# Patient Record
Sex: Female | Born: 1980 | Race: White | Hispanic: No | Smoking: Never smoker
Health system: Southern US, Community
[De-identification: ages and names within clinical notes are randomized; demographics above are authoritative.]

## PROBLEM LIST (undated history)

## (undated) ENCOUNTER — Inpatient Hospital Stay (HOSPITAL_COMMUNITY): Payer: Self-pay

## (undated) DIAGNOSIS — Z8711 Personal history of peptic ulcer disease: Secondary | ICD-10-CM

## (undated) DIAGNOSIS — O24419 Gestational diabetes mellitus in pregnancy, unspecified control: Secondary | ICD-10-CM

## (undated) DIAGNOSIS — D649 Anemia, unspecified: Secondary | ICD-10-CM

## (undated) DIAGNOSIS — E559 Vitamin D deficiency, unspecified: Secondary | ICD-10-CM

## (undated) DIAGNOSIS — Z8719 Personal history of other diseases of the digestive system: Secondary | ICD-10-CM

## (undated) HISTORY — PX: TONSILLECTOMY AND ADENOIDECTOMY: SHX28

## (undated) HISTORY — DX: Personal history of peptic ulcer disease: Z87.11

## (undated) HISTORY — PX: CHOLECYSTECTOMY: SHX55

## (undated) HISTORY — DX: Vitamin D deficiency, unspecified: E55.9

## (undated) HISTORY — DX: Anemia, unspecified: D64.9

## (undated) HISTORY — DX: Personal history of other diseases of the digestive system: Z87.19

## (undated) HISTORY — DX: Gestational diabetes mellitus in pregnancy, unspecified control: O24.419

## (undated) HISTORY — PX: WISDOM TOOTH EXTRACTION: SHX21

## (undated) HISTORY — PX: BARTHOLIN GLAND CYST EXCISION: SHX565

---

## 2008-06-24 ENCOUNTER — Emergency Department (HOSPITAL_BASED_OUTPATIENT_CLINIC_OR_DEPARTMENT_OTHER): Admission: EM | Admit: 2008-06-24 | Discharge: 2008-06-24 | Payer: Self-pay | Admitting: Emergency Medicine

## 2009-10-11 ENCOUNTER — Emergency Department (HOSPITAL_BASED_OUTPATIENT_CLINIC_OR_DEPARTMENT_OTHER): Admission: EM | Admit: 2009-10-11 | Discharge: 2009-10-11 | Payer: Self-pay | Admitting: Emergency Medicine

## 2009-10-11 ENCOUNTER — Ambulatory Visit: Payer: Self-pay | Admitting: Diagnostic Radiology

## 2010-08-29 ENCOUNTER — Emergency Department (HOSPITAL_BASED_OUTPATIENT_CLINIC_OR_DEPARTMENT_OTHER)
Admission: EM | Admit: 2010-08-29 | Discharge: 2010-08-29 | Disposition: A | Discharge status: Home / Self Care | Payer: BC Managed Care – PPO | Attending: Emergency Medicine | Admitting: Emergency Medicine

## 2010-08-29 DIAGNOSIS — K589 Irritable bowel syndrome without diarrhea: Secondary | ICD-10-CM | POA: Insufficient documentation

## 2010-08-29 DIAGNOSIS — R197 Diarrhea, unspecified: Secondary | ICD-10-CM | POA: Insufficient documentation

## 2010-08-29 DIAGNOSIS — M79609 Pain in unspecified limb: Secondary | ICD-10-CM | POA: Insufficient documentation

## 2010-08-29 LAB — CBC
HCT: 39.3 % (ref 36.0–46.0)
Hemoglobin: 13.1 g/dL (ref 12.0–15.0)
MCH: 30.1 pg (ref 26.0–34.0)
Platelets: 259 10*3/uL (ref 150–400)
RBC: 4.35 MIL/uL (ref 3.87–5.11)
RDW: 12.5 % (ref 11.5–15.5)
WBC: 14.3 10*3/uL — ABNORMAL HIGH (ref 4.0–10.5)

## 2010-08-29 LAB — DIFFERENTIAL
Basophils Absolute: 0.1 10*3/uL (ref 0.0–0.1)
Basophils Relative: 0 % (ref 0–1)
Eosinophils Absolute: 0.2 10*3/uL (ref 0.0–0.7)
Lymphs Abs: 3.2 10*3/uL (ref 0.7–4.0)
Monocytes Relative: 5 % (ref 3–12)
Neutro Abs: 10.2 10*3/uL — ABNORMAL HIGH (ref 1.7–7.7)
Neutrophils Relative %: 71 % (ref 43–77)

## 2010-08-29 LAB — BASIC METABOLIC PANEL
CO2: 24 mEq/L (ref 19–32)
Calcium: 9.4 mg/dL (ref 8.4–10.5)
Creatinine, Ser: 0.6 mg/dL (ref 0.4–1.2)
GFR calc non Af Amer: >60 mL/min (ref >60)
Potassium: 4 mEq/L (ref 3.5–5.1)

## 2010-08-29 LAB — URINALYSIS, ROUTINE W REFLEX MICROSCOPIC
Ketones, ur: NEGATIVE mg/dL
Leukocytes, UA: NEGATIVE

## 2010-08-29 LAB — PREGNANCY, URINE: Preg Test, Ur: NEGATIVE

## 2013-09-16 ENCOUNTER — Encounter: Payer: Self-pay | Admitting: Obstetrics & Gynecology

## 2013-09-16 ENCOUNTER — Ambulatory Visit (INDEPENDENT_AMBULATORY_CARE_PROVIDER_SITE_OTHER): Payer: BC Managed Care – PPO | Admitting: Obstetrics & Gynecology

## 2013-09-16 VITALS — BP 127/86 | HR 89 | Ht 66.0 in | Wt 250.0 lb

## 2013-09-16 DIAGNOSIS — Z3480 Encounter for supervision of other normal pregnancy, unspecified trimester: Secondary | ICD-10-CM

## 2013-09-16 DIAGNOSIS — O09291 Supervision of pregnancy with other poor reproductive or obstetric history, first trimester: Secondary | ICD-10-CM

## 2013-09-16 DIAGNOSIS — Z3491 Encounter for supervision of normal pregnancy, unspecified, first trimester: Secondary | ICD-10-CM

## 2013-09-16 DIAGNOSIS — E669 Obesity, unspecified: Secondary | ICD-10-CM

## 2013-09-16 DIAGNOSIS — O99210 Obesity complicating pregnancy, unspecified trimester: Secondary | ICD-10-CM | POA: Insufficient documentation

## 2013-09-16 DIAGNOSIS — O99211 Obesity complicating pregnancy, first trimester: Secondary | ICD-10-CM

## 2013-09-16 DIAGNOSIS — O09299 Supervision of pregnancy with other poor reproductive or obstetric history, unspecified trimester: Secondary | ICD-10-CM | POA: Insufficient documentation

## 2013-09-16 DIAGNOSIS — Z3490 Encounter for supervision of normal pregnancy, unspecified, unspecified trimester: Secondary | ICD-10-CM | POA: Insufficient documentation

## 2013-09-16 MED ORDER — CITRANATAL 90 DHA 90-1 & 300 MG PO MISC: 1.0000 | Freq: Every day | ORAL | Status: DC

## 2013-09-16 NOTE — Progress Notes (Signed)
Subjective:    Brittney Moore is a [age] [id number].[id number] at 5634w5d by LMP, being seen today for her first obstetrical visit.  Her obstetrical history is significant for twin demise at 24 weeks (discovered on routine PNV, no PTL/PPROM, was induced and had SVD) and two subsequent term SVDs. Patient does intend to breast feed. Pregnancy history fully reviewed.  Patient reports no complaints.  Had normal pap in 04/2013.  Filed Vitals:   09/16/13 0813 09/16/13 0816  BP: 127/86   Pulse: 89   Height:  5\' 6"  (1.676 m)  Weight: 250 lb (113.399 kg)     HISTORY: OB History  Gravida Para Term Preterm AB SAB TAB Ectopic Multiple Living  4 3 2 1  0 0   1 2    # Outcome Date GA Lbr Len/2nd Weight Sex Delivery Anes PTL Lv  4 CUR           3A PRE 09/16/93 2243w0d    SVD  N ND  3B  09/16/93 7143w0d    SVD  N ND  2 TRM    7 lb (3.175 kg) M SVD        Comments: No antenatal testin  1 TRM    7 lb (3.175 kg) F SVD        Comments: No antenatal testing     Past Medical History  Diagnosis Date  . Anemia   . History of stomach ulcers   . Vitamin D deficiency    Past Surgical History  Procedure Laterality Date  . Cholecystectomy    . Tonsillectomy and adenoidectomy    . Wisdom tooth extraction    . Bartholin gland cyst excision     Family History  Problem Relation Age of Onset  . Congestive Heart Failure Mother   . Leukemia Father   . Heart disease Father   . Diabetes Father      Exam    Uterus:     Pelvic Exam:    Perineum: No Hemorrhoids, Normal Perineum   Vulva: normal   Vagina:  normal discharge   Cervix: multiparous appearance and no cervical motion tenderness   Adnexa: no mass, fullness, tenderness   Bony Pelvis: average and proven to 7 lbs  System: Breast:  normal appearance, no masses or tenderness   Skin: normal coloration and turgor, no rashes   Neurologic: oriented, normal   Extremities: normal strength, tone, and muscle mass, no deformities   HEENT PERRLA   Mouth/Teeth  mucous membranes moist, pharynx normal without lesions and dental hygiene good   Neck supple and no masses   Cardiovascular: regular rate and rhythm   Respiratory:  appears well, vitals normal, no respiratory distress, acyanotic, normal RR, chest clear, no wheezing, crepitations, rhonchi, normal symmetric air entry   Abdomen: soft, non-tender; bowel sounds normal; no masses,  no organomegaly   Urinary: urethral meatus normal   Clinic ultrasound showed SIUP, +cardiac activity   Assessment:    Pregnancy: [id number] Patient Active Problem List   Diagnosis Date Noted  . Supervision of normal pregnancy 09/16/2013  . Obesity in pregnancy, antepartum 09/16/2013  . Prior pregnancy with 24 week twin fetal demise, antepartum 09/16/2013     Plan:   Initial labs drawn.  Will get 1 hr GTT next visit. Continue Prenatal vitamins. Patient interested in referral to Nutritionist; advised ? 15 lb weight gain Problem list reviewed and updated. Genetic Screening discussed First Screen: ordered. Ultrasound discussed; fetal survey: to be ordered later. Follow up  in 4 weeks.  Routine obstetric precautions reviewed.   Tereso NewcomerANYANWU,UGONNA A, MD 09/16/2013

## 2013-09-16 NOTE — Addendum Note (Signed)
Addended by: Granville LewisLARK, Farheen Pfahler L on: 09/16/2013 11:37 AM   Modules accepted: Orders

## 2013-09-16 NOTE — Progress Notes (Signed)
Pt here with spouse for NOB intake.  She has not used BC in 12 years and this is the first pregnancy for FOB.  Last pap Feb 15  No H/O abnormal paps.

## 2013-09-16 NOTE — Patient Instructions (Signed)

## 2013-09-16 NOTE — Progress Notes (Signed)
Bedside U/S showed IUP with FHT of 160 BPM and GA 710w1day  CRL 32.1085mm

## 2013-09-17 LAB — OBSTETRIC PANEL
ANTIBODY SCREEN: NEGATIVE
BASOS ABS: 0 10*3/uL (ref 0.0–0.1)
BASOS PCT: 0 % (ref 0–1)
EOS PCT: 1 % (ref 0–5)
Eosinophils Absolute: 0.1 10*3/uL (ref 0.0–0.7)
HCT: 38.7 % (ref 36.0–46.0)
HEMOGLOBIN: 13 g/dL (ref 12.0–15.0)
Hepatitis B Surface Ag: NEGATIVE
LYMPHS ABS: 1.7 10*3/uL (ref 0.7–4.0)
LYMPHS PCT: 16 % (ref 12–46)
MCH: 30.1 pg (ref 26.0–34.0)
MCHC: 33.6 g/dL (ref 30.0–36.0)
MCV: 89.6 fL (ref 78.0–100.0)
Monocytes Absolute: 0.3 10*3/uL (ref 0.1–1.0)
Monocytes Relative: 3 % (ref 3–12)
NEUTROS ABS: 8.6 10*3/uL — AB (ref 1.7–7.7)
Neutrophils Relative %: 80 % — ABNORMAL HIGH (ref 43–77)
PLATELETS: 252 10*3/uL (ref 150–400)
RBC: 4.32 MIL/uL (ref 3.87–5.11)
RDW: 13.5 % (ref 11.5–15.5)
RH TYPE: POSITIVE
Rubella: 1.15 Index — ABNORMAL HIGH (ref <0.90)
WBC: 10.7 10*3/uL — ABNORMAL HIGH (ref 4.0–10.5)

## 2013-09-17 LAB — HIV ANTIBODY (ROUTINE TESTING W REFLEX): HIV: NONREACTIVE

## 2013-09-17 LAB — CULTURE, OB URINE: Colony Count: 50000

## 2013-09-17 LAB — GC/CHLAMYDIA PROBE AMP
CT Probe RNA: NEGATIVE
GC PROBE AMP APTIMA: NEGATIVE

## 2013-09-19 ENCOUNTER — Encounter: Payer: Self-pay | Admitting: Obstetrics & Gynecology

## 2013-09-19 LAB — CYSTIC FIBROSIS DIAGNOSTIC STUDY

## 2013-09-20 ENCOUNTER — Encounter: Payer: Self-pay | Admitting: *Deleted

## 2013-09-20 ENCOUNTER — Encounter: Payer: BC Managed Care – PPO | Attending: Obstetrics & Gynecology | Admitting: *Deleted

## 2013-09-20 DIAGNOSIS — E669 Obesity, unspecified: Secondary | ICD-10-CM | POA: Diagnosis not present

## 2013-09-20 DIAGNOSIS — O99210 Obesity complicating pregnancy, unspecified trimester: Principal | ICD-10-CM

## 2013-09-20 DIAGNOSIS — Z713 Dietary counseling and surveillance: Secondary | ICD-10-CM | POA: Diagnosis not present

## 2013-09-20 DIAGNOSIS — O99211 Obesity complicating pregnancy, first trimester: Secondary | ICD-10-CM

## 2013-09-20 NOTE — Patient Instructions (Signed)
Goals:  Put away the scale.  Refrain from weighing self between nutrition appointments Reject diet mentality- there are no good or bad foods Listen to internal hunger cues and honor those cues; don't wait until you're ravenous to eat Choose the food(s) you want Enjoy those foods.  Try to make meal last 20 minutes Stop eating when full.  Honor fullness cues too Do these things without any guilt or regret
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
## 2013-10-07 ENCOUNTER — Ambulatory Visit (INDEPENDENT_AMBULATORY_CARE_PROVIDER_SITE_OTHER): Payer: BC Managed Care – PPO | Admitting: Advanced Practice Midwife

## 2013-10-07 VITALS — BP 117/82 | HR 81 | Temp 98.2°F | Wt 248.0 lb

## 2013-10-07 DIAGNOSIS — O2390 Unspecified genitourinary tract infection in pregnancy, unspecified trimester: Secondary | ICD-10-CM

## 2013-10-07 DIAGNOSIS — R319 Hematuria, unspecified: Secondary | ICD-10-CM

## 2013-10-07 DIAGNOSIS — N390 Urinary tract infection, site not specified: Secondary | ICD-10-CM

## 2013-10-07 DIAGNOSIS — O219 Vomiting of pregnancy, unspecified: Secondary | ICD-10-CM

## 2013-10-07 DIAGNOSIS — O2341 Unspecified infection of urinary tract in pregnancy, first trimester: Secondary | ICD-10-CM

## 2013-10-07 LAB — POCT URINALYSIS DIPSTICK
BILIRUBIN UA: NEGATIVE
GLUCOSE UA: NEGATIVE
NITRITE UA: NEGATIVE
Protein, UA: NEGATIVE
Urobilinogen, UA: NEGATIVE
pH, UA: 6.5

## 2013-10-07 MED ORDER — CEPHALEXIN 500 MG PO CAPS: 500.0000 mg | ORAL_CAPSULE | Freq: Four times a day (QID) | ORAL | Status: DC

## 2013-10-07 MED ORDER — DICLEGIS 10-10 MG PO TBEC: 2.0000 | DELAYED_RELEASE_TABLET | ORAL | Status: DC

## 2013-10-07 MED ORDER — METOCLOPRAMIDE HCL 10 MG PO TABS: 10.0000 mg | ORAL_TABLET | Freq: Three times a day (TID) | ORAL | Status: DC

## 2013-10-07 NOTE — Progress Notes (Signed)
Feels like she has UTI symptoms.  C/O's N and V and was told to do the [employer] diet.  Having diarrhea also

## 2013-10-07 NOTE — Patient Instructions (Signed)
Pregnancy and Urinary Tract Infection  A urinary tract infection (UTI) is a bacterial infection of the urinary tract. Infection of the urinary tract can include the ureters, kidneys (pyelonephritis), bladder (cystitis), and urethra (urethritis). All pregnant women should be screened for bacteria in the urinary tract. Identifying and treating a UTI will decrease the risk of preterm labor and developing more serious infections in both the mother and baby.  CAUSES  Bacteria germs cause almost all UTIs.   RISK FACTORS  Many factors can increase your chances of getting a UTI during pregnancy. These include:  · Having a short urethra.  · Poor toilet and hygiene habits.  · Sexual intercourse.  · Blockage of urine along the urinary tract.  · Problems with the pelvic muscles or nerves.  · Diabetes.  · Obesity.  · Bladder problems after having several children.  · Previous history of UTI.  SIGNS AND SYMPTOMS   · Pain, burning, or a stinging feeling when urinating.  · Suddenly feeling the need to urinate right away (urgency).  · Loss of bladder control (urinary incontinence).  · Frequent urination, more than is common with pregnancy.  · Lower abdominal or back discomfort.  · Cloudy urine.  · Blood in the urine (hematuria).  · Fever.   When the kidneys are infected, the symptoms may be:  · Back pain.  · Flank pain on the right side more so than the left.  · Fever.  · Chills.  · Nausea.  · Vomiting.  DIAGNOSIS   A urinary tract infection is usually diagnosed through urine tests. Additional tests and procedures are sometimes done. These may include:  · Ultrasound exam of the kidneys, ureters, bladder, and urethra.  · Looking in the bladder with a lighted tube (cystoscopy).  TREATMENT  Typically, UTIs can be treated with antibiotic medicines.   HOME CARE INSTRUCTIONS   · Only take over-the-counter or prescription medicines as directed by your health care provider. If you were prescribed antibiotics, take them as directed. Finish  them even if you start to feel better.  · Drink enough fluids to keep your urine clear or pale yellow.  · Do not have sexual intercourse until the infection is gone and your health care provider says it is okay.  · Make sure you are tested for UTIs throughout your pregnancy. These infections often come back.   Preventing a UTI in the Future  · Practice good toilet habits. Always wipe from front to back. Use the tissue only once.  · Do not hold your urine. Empty your bladder as soon as possible when the urge comes.  · Do not douche or use deodorant sprays.  · Wash with soap and warm water around the genital area and the anus.  · Empty your bladder before and after sexual intercourse.  · Wear underwear with a cotton crotch.  · Avoid caffeine and carbonated drinks. They can irritate the bladder.  · Drink cranberry juice or take cranberry pills. This may decrease the risk of getting a UTI.  · Do not drink alcohol.  · Keep all your appointments and tests as scheduled.   SEEK MEDICAL CARE IF:   · Your symptoms get worse.  · You are still having fevers 2 or more days after treatment begins.  · You have a rash.  · You feel that you are having problems with medicines prescribed.  · You have abnormal vaginal discharge.  SEEK IMMEDIATE MEDICAL CARE IF:   · You have back or flank   well or get worse.  Document Released: 07/02/2010 Document Revised: 12/26/2012 Document Reviewed: 10/04/2012 [facility] Patient Information 2015 [employer] [place]. This information is not intended to replace advice given to you by your health care provider. Make sure you discuss any questions you have with your health care provider.   If you  develop a yeast infection after your antibiotics you may use over-the-counter Monistat 7.

## 2013-10-09 ENCOUNTER — Telehealth: Payer: Self-pay | Admitting: *Deleted

## 2013-10-09 DIAGNOSIS — N390 Urinary tract infection, site not specified: Secondary | ICD-10-CM

## 2013-10-09 LAB — CULTURE, URINE COMPREHENSIVE: Colony Count: >=100000

## 2013-10-09 MED ORDER — SULFAMETHOXAZOLE-TMP DS 800-160 MG PO TABS: 1.0000 | ORAL_TABLET | Freq: Two times a day (BID) | ORAL | Status: DC

## 2013-10-09 NOTE — Telephone Encounter (Signed)
Pt called stating that her Keflex has given her a rash since starting it on Monday.  Spoke with Dr Penne LashLeggett, who discontinued it and new RX for Bactrim DS called to pharmacy.

## 2013-10-10 DIAGNOSIS — O2340 Unspecified infection of urinary tract in pregnancy, unspecified trimester: Secondary | ICD-10-CM | POA: Insufficient documentation

## 2013-10-10 NOTE — Progress Notes (Signed)
Reports dysuria, urgency, frequency, LBP and few loose stools. Sees blood when she urinates, but unsure if blood is vaginal or urinary in origin. Has been having N/V of pregnancy vomiting 0-1 x per day, btu w/ constant nausea. Sx have worsened over the past few days. Hasn't tried anything for Sx. No blood in vault of spec exam. Physiologic discharge.  No clear CVAT. Suspect UTI w/ possible gastroenteritis vs GI Sx from severe UTI. Rx Reglan and Macrobid. Culture urine. Diclegis for N/V of pregnancy. Increase fluids.

## 2013-10-14 ENCOUNTER — Encounter: Payer: Self-pay | Admitting: *Deleted

## 2013-10-14 ENCOUNTER — Ambulatory Visit (INDEPENDENT_AMBULATORY_CARE_PROVIDER_SITE_OTHER): Payer: BC Managed Care – PPO | Admitting: Advanced Practice Midwife

## 2013-10-14 VITALS — BP 119/81 | HR 84 | Wt 247.0 lb

## 2013-10-14 DIAGNOSIS — Z3480 Encounter for supervision of other normal pregnancy, unspecified trimester: Secondary | ICD-10-CM

## 2013-10-14 DIAGNOSIS — Z3482 Encounter for supervision of other normal pregnancy, second trimester: Secondary | ICD-10-CM

## 2013-10-14 NOTE — Patient Instructions (Signed)
Second Trimester of Pregnancy The second trimester is from week 13 through week 28, months 4 through 6. The second trimester is often a time when you feel your best. Your body has also adjusted to being pregnant, and you begin to feel better physically. Usually, morning sickness has lessened or quit completely, you may have more energy, and you may have an increase in appetite. The second trimester is also a time when the fetus is growing rapidly. At the end of the sixth month, the fetus is about 9 inches long and weighs about 1 pounds. You will likely begin to feel the baby move (quickening) between 18 and 20 weeks of the pregnancy. BODY CHANGES Your body goes through many changes during pregnancy. The changes vary from woman to woman.   Your weight will continue to increase. You will notice your lower abdomen bulging out.  You may begin to get stretch marks on your hips, abdomen, and breasts.  You may develop headaches that can be relieved by medicines approved by your health care provider.  You may urinate more often because the fetus is pressing on your bladder.  You may develop or continue to have heartburn as a result of your pregnancy.  You may develop constipation because certain hormones are causing the muscles that push waste through your intestines to slow down.  You may develop hemorrhoids or swollen, bulging veins (varicose veins).  You may have back pain because of the weight gain and pregnancy hormones relaxing your joints between the bones in your pelvis and as a result of a shift in weight and the muscles that support your balance.  Your breasts will continue to grow and be tender.  Your gums may bleed and may be sensitive to brushing and flossing.  Dark spots or blotches (chloasma, mask of pregnancy) may develop on your face. This will likely fade after the baby is born.  A dark line from your belly button to the pubic area (linea nigra) may appear. This will likely fade  after the baby is born.  You may have changes in your hair. These can include thickening of your hair, rapid growth, and changes in texture. Some women also have hair loss during or after pregnancy, or hair that feels dry or thin. Your hair will most likely return to normal after your baby is born. WHAT TO EXPECT AT YOUR PRENATAL VISITS During a routine prenatal visit:  You will be weighed to make sure you and the fetus are growing normally.  Your blood pressure will be taken.  Your abdomen will be measured to track your baby's growth.  The fetal heartbeat will be listened to.  Any test results from the previous visit will be discussed. Your health care provider may ask you:  How you are feeling.  If you are feeling the baby move.  If you have had any abnormal symptoms, such as leaking fluid, bleeding, severe headaches, or abdominal cramping.  If you have any questions. Other tests that may be performed during your second trimester include:  Blood tests that check for:  Low iron levels (anemia).  Gestational diabetes (between 24 and 28 weeks).  Rh antibodies.  Urine tests to check for infections, diabetes, or protein in the urine.  An ultrasound to confirm the proper growth and development of the baby.  An amniocentesis to check for possible genetic problems.  Fetal screens for spina bifida and Down syndrome. HOME CARE INSTRUCTIONS   Avoid all smoking, herbs, alcohol, and unprescribed   drugs. These chemicals affect the formation and growth of the baby.  Follow your health care provider's instructions regarding medicine use. There are medicines that are either safe or unsafe to take during pregnancy.  Exercise only as directed by your health care provider. Experiencing uterine cramps is a good sign to stop exercising.  Continue to eat regular, healthy meals.  Wear a good support bra for breast tenderness.  Do not use hot tubs, steam rooms, or saunas.  Wear your  seat belt at all times when driving.  Avoid raw meat, uncooked cheese, cat litter boxes, and soil used by cats. These carry germs that can cause birth defects in the baby.  Take your prenatal vitamins.  Try taking a stool softener (if your health care provider approves) if you develop constipation. Eat more high-fiber foods, such as fresh vegetables or fruit and whole grains. Drink plenty of fluids to keep your urine clear or pale yellow.  Take warm sitz baths to soothe any pain or discomfort caused by hemorrhoids. Use hemorrhoid cream if your health care provider approves.  If you develop varicose veins, wear support hose. Elevate your feet for 15 minutes, 3-4 times a day. Limit salt in your diet.  Avoid heavy lifting, wear low heel shoes, and practice good posture.  Rest with your legs elevated if you have leg cramps or low back pain.  Visit your dentist if you have not gone yet during your pregnancy. Use a soft toothbrush to brush your teeth and be gentle when you floss.  A sexual relationship may be continued unless your health care provider directs you otherwise.  Continue to go to all your prenatal visits as directed by your health care provider. SEEK MEDICAL CARE IF:   You have dizziness.  You have mild pelvic cramps, pelvic pressure, or nagging pain in the abdominal area.  You have persistent nausea, vomiting, or diarrhea.  You have a bad smelling vaginal discharge.  You have pain with urination. SEEK IMMEDIATE MEDICAL CARE IF:   You have a fever.  You are leaking fluid from your vagina.  You have spotting or bleeding from your vagina.  You have severe abdominal cramping or pain.  You have rapid weight gain or loss.  You have shortness of breath with chest pain.  You notice sudden or extreme swelling of your face, hands, ankles, feet, or legs.  You have not felt your baby move in over an hour.  You have severe headaches that do not go away with  medicine.  You have vision changes. Document Released: 03/01/2001 Document Revised: 03/12/2013 Document Reviewed: 05/08/2012 [employer] Patient Information 2015 [employer]. This information is not intended to replace advice given to you by your health care provider. Make sure you discuss any questions you have with your health care provider.  

## 2013-10-14 NOTE — Progress Notes (Signed)
First Screen not ordered at last visit. Can do it up till tomorrow.  Will try to see if they can do it today. >> MFM will do tomorrow.  Spec exam for post coital bleeding:  No blood visible, cervix closed.  UA clear.

## 2013-10-14 NOTE — Progress Notes (Signed)
Spotted after intercourse on Friday.

## 2013-10-15 ENCOUNTER — Ambulatory Visit (HOSPITAL_COMMUNITY): Admission: RE | Admit: 2013-10-15 | Payer: BC Managed Care – PPO | Source: Ambulatory Visit

## 2013-10-15 ENCOUNTER — Other Ambulatory Visit: Payer: Self-pay | Admitting: Advanced Practice Midwife

## 2013-10-15 ENCOUNTER — Ambulatory Visit (HOSPITAL_COMMUNITY)
Admission: RE | Admit: 2013-10-15 | Discharge: 2013-10-15 | Disposition: A | Discharge status: Home / Self Care | Payer: BC Managed Care – PPO | Source: Ambulatory Visit | Attending: Obstetrics & Gynecology | Admitting: Obstetrics & Gynecology

## 2013-10-15 DIAGNOSIS — Z3482 Encounter for supervision of other normal pregnancy, second trimester: Secondary | ICD-10-CM

## 2013-10-15 DIAGNOSIS — Z3689 Encounter for other specified antenatal screening: Secondary | ICD-10-CM | POA: Insufficient documentation

## 2013-10-16 ENCOUNTER — Encounter: Payer: Self-pay | Admitting: Advanced Practice Midwife

## 2013-10-31 ENCOUNTER — Encounter: Payer: Self-pay | Admitting: *Deleted

## 2013-10-31 ENCOUNTER — Encounter: Payer: BC Managed Care – PPO | Attending: Obstetrics & Gynecology | Admitting: *Deleted

## 2013-10-31 DIAGNOSIS — O99210 Obesity complicating pregnancy, unspecified trimester: Principal | ICD-10-CM

## 2013-10-31 DIAGNOSIS — O99212 Obesity complicating pregnancy, second trimester: Secondary | ICD-10-CM

## 2013-10-31 DIAGNOSIS — E669 Obesity, unspecified: Secondary | ICD-10-CM | POA: Insufficient documentation

## 2013-10-31 DIAGNOSIS — Z713 Dietary counseling and surveillance: Secondary | ICD-10-CM | POA: Diagnosis not present

## 2013-10-31 NOTE — Progress Notes (Signed)
  Medical Nutrition Therapy:  Appt start time: 0900 end time:  0930.  Assessment:  Primary concerns today: Brittney Moore is here for follow up nutrition counseling pertaining to heigh weight gain.  She has been following my recommendations for intuitive eating and doing really well.  She eats when she is hungry and stops when she is full.  She isn't counting calories or feeling guilty about her food choices.  States she has been really sick and has lost weight.  Her baby is growing fine, despite her weight loss.  She thinks she's not back down to her pre-gravid weight, but she is loosing.  She's very pleased with her progress and pleased to have a different approach to food.    Preferred Learning Style:   Auditory  Learning Readiness:   Change in progress  MEDICATIONS: prenatal vitamins, iron, vitamin d, omega-3   DIETARY INTAKE:  Usual eating pattern includes 3 meals and 0-2 snacks per day.  Everyday foods include lean proteins, starches, fruits and vegetables.  Avoided foods include "junk";.    24-hr recall:  B ( AM): small amount of cereal with enough milk to cover it 1/2 bagel with 1 tbsp cream cheese Snk ( AM): maybe, if hungry  L ( PM): 2 potatoes with fat free sour cream and 2-3 tsp butter and frozen entree with pasta Snk ( PM): fruit or fruit snack.  Limits sugary foods.  Hasn't been feeling them.  Did eat more candy yesterday, but ate it mindfully D ( PM): hamburger helper or spaghetti Snk ( PM): sometimes frozen snickers bars Beverages: water all day long.  Juice makes her sick  Usual physical activity: none right now due to poor energy.    Estimated energy needs: 2000 calories 225 g carbohydrates 150 g protein 56 g fat    Nutritional Diagnosis:  NB-1.5 Disordered eating pattern As related to history of extreme dieting and mindless eating.  As evidenced by dietary recall.    Intervention:  Nutrition counseling provided.  Answered questions and discussed recommendations  for physical activity: walking when she feels up to it for 10-30 minutes 2-5 days/week.  Suggested adding protein to snack.  Keep up great work!  Teaching Method Utilized:  Auditory   Barriers to learning/adherence to lifestyle change: none  Demonstrated degree of understanding via:  Teach Back   Monitoring/Evaluation:  Dietary intake, exercise, lab results, and body weight prn

## 2013-11-11 ENCOUNTER — Ambulatory Visit (INDEPENDENT_AMBULATORY_CARE_PROVIDER_SITE_OTHER): Payer: BC Managed Care – PPO | Admitting: Advanced Practice Midwife

## 2013-11-11 VITALS — BP 120/82 | HR 88 | Wt 252.0 lb

## 2013-11-11 DIAGNOSIS — O219 Vomiting of pregnancy, unspecified: Secondary | ICD-10-CM

## 2013-11-11 DIAGNOSIS — Z3480 Encounter for supervision of other normal pregnancy, unspecified trimester: Secondary | ICD-10-CM

## 2013-11-11 DIAGNOSIS — Z3482 Encounter for supervision of other normal pregnancy, second trimester: Secondary | ICD-10-CM

## 2013-11-11 MED ORDER — DICLEGIS 10-10 MG PO TBEC: DELAYED_RELEASE_TABLET | ORAL | Status: DC

## 2013-11-11 NOTE — Progress Notes (Signed)
Good fetal movement, denies vaginal bleeding, LOF, regular contractions.  Nausea daily with vomiting some days.  Does not want to take meds.  Reviewed safety of medications, importance of keeping down food and fluids.  Reassurance provided that nausea will likely improve after 20 weeks.  Diclegis Rx renewed, discussed taking Vitamin B 6 only if preferred.  Anatomy U/S ordered for 19 weeks. Early 1 hour GTT today.

## 2013-11-12 LAB — GLUCOSE TOLERANCE, 1 HOUR (50G) W/O FASTING: Glucose, 1 Hour GTT: 153 mg/dL — ABNORMAL HIGH (ref 70–140)

## 2013-11-13 ENCOUNTER — Telehealth: Payer: Self-pay | Admitting: *Deleted

## 2013-11-13 DIAGNOSIS — R7309 Other abnormal glucose: Secondary | ICD-10-CM

## 2013-11-13 NOTE — Telephone Encounter (Signed)
Pt notified of abnormal 1 hr GTT and scheduled for 3 hour GTT Friday 11/15/13

## 2013-11-14 LAB — ALPHA FETOPROTEIN, MATERNAL
AFP: 21.7 ng/mL
Curr Gest Age: 17.5 wks.days
MOM FOR AFP: 0.74
Open Spina bifida: NEGATIVE
Osb Risk: <1:27300 {titer}

## 2013-11-16 LAB — GLUCOSE TOLERANCE, 3 HOURS
GLUCOSE 3 HOUR GTT: 127 mg/dL (ref 70–144)
Glucose Tolerance, 1 hour: 172 mg/dL (ref 70–189)
Glucose Tolerance, 2 hour: 174 mg/dL — ABNORMAL HIGH (ref 70–164)
Glucose Tolerance, Fasting: 98 mg/dL (ref 70–104)

## 2013-11-18 ENCOUNTER — Telehealth: Payer: Self-pay | Admitting: *Deleted

## 2013-11-18 NOTE — Telephone Encounter (Signed)
Pt notified of normal 3 hr GTT. 

## 2013-11-20 ENCOUNTER — Encounter: Payer: Self-pay | Admitting: Advanced Practice Midwife

## 2013-11-26 ENCOUNTER — Ambulatory Visit (HOSPITAL_COMMUNITY)
Admission: RE | Admit: 2013-11-26 | Discharge: 2013-11-26 | Disposition: A | Discharge status: Home / Self Care | Payer: BC Managed Care – PPO | Source: Ambulatory Visit | Attending: Advanced Practice Midwife | Admitting: Advanced Practice Midwife

## 2013-11-26 DIAGNOSIS — E669 Obesity, unspecified: Secondary | ICD-10-CM | POA: Insufficient documentation

## 2013-11-26 DIAGNOSIS — O99210 Obesity complicating pregnancy, unspecified trimester: Secondary | ICD-10-CM | POA: Diagnosis present

## 2013-11-26 DIAGNOSIS — O09292 Supervision of pregnancy with other poor reproductive or obstetric history, second trimester: Secondary | ICD-10-CM

## 2013-11-26 DIAGNOSIS — O99212 Obesity complicating pregnancy, second trimester: Secondary | ICD-10-CM

## 2013-11-26 DIAGNOSIS — O09299 Supervision of pregnancy with other poor reproductive or obstetric history, unspecified trimester: Secondary | ICD-10-CM | POA: Insufficient documentation

## 2013-11-26 DIAGNOSIS — O99213 Obesity complicating pregnancy, third trimester: Secondary | ICD-10-CM

## 2013-11-26 DIAGNOSIS — Z3482 Encounter for supervision of other normal pregnancy, second trimester: Secondary | ICD-10-CM

## 2013-11-28 ENCOUNTER — Ambulatory Visit (INDEPENDENT_AMBULATORY_CARE_PROVIDER_SITE_OTHER): Payer: BC Managed Care – PPO | Admitting: Obstetrics & Gynecology

## 2013-11-28 ENCOUNTER — Encounter: Payer: Self-pay | Admitting: Obstetrics & Gynecology

## 2013-11-28 ENCOUNTER — Telehealth: Payer: Self-pay | Admitting: *Deleted

## 2013-11-28 ENCOUNTER — Encounter: Payer: Self-pay | Admitting: *Deleted

## 2013-11-28 VITALS — BP 125/83 | HR 87 | Temp 97.7°F | Wt 254.0 lb

## 2013-11-28 DIAGNOSIS — Z3480 Encounter for supervision of other normal pregnancy, unspecified trimester: Secondary | ICD-10-CM

## 2013-11-28 DIAGNOSIS — Z3492 Encounter for supervision of normal pregnancy, unspecified, second trimester: Secondary | ICD-10-CM

## 2013-11-28 DIAGNOSIS — Z3482 Encounter for supervision of other normal pregnancy, second trimester: Secondary | ICD-10-CM

## 2013-11-28 DIAGNOSIS — G4730 Sleep apnea, unspecified: Secondary | ICD-10-CM

## 2013-11-28 MED ORDER — MEDICAL COMPRESSION STOCKINGS MISC: 1.0000 "application " | Status: DC | PRN

## 2013-11-28 MED ORDER — ONDANSETRON HCL 8 MG PO TABS: 8.0000 mg | ORAL_TABLET | Freq: Three times a day (TID) | ORAL | Status: DC | PRN

## 2013-11-28 NOTE — Progress Notes (Signed)
Pt having increased nausea during the day.  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  Pt having headaches and problems sleeping.  Suggested to take Tylenol.  Pt admits to being diagnosed with sleep apnea.  Pt agrees to make appt with sleep MD and see if CPAP is necessary.  She is 30+ pounds heavier than she was upon diagnosis.   Pt needs f/u US for incomplete anatomy.  Pt having LE swelling.  Will try compression stockings.

## 2013-11-28 NOTE — Patient Instructions (Signed)
Compression Stockings Compression stockings are elastic stockings that "compress" your legs. This helps to increase blood flow, decrease swelling, and reduces the chance of getting blood clots in your lower legs. Compression stockings are used:  After surgery.  If you have a history of poor circulation.  If you are prone to blood clots.  If you have varicose veins.  If you sit or are bedridden for long periods of time. WEARING COMPRESSION STOCKINGS  Your compression stockings should be worn as instructed by your caregiver.  Wearing the correct stocking size is important. Your caregiver can help measure and fit you to the correct size.  When wearing your stockings, do not allow the stockings to bunch up. This is especially important around your toes or behind your knees. Keep the stockings as smooth as possible.  Do not roll the stockings downward and leave them rolled down. This can form a restrictive band around your legs and can decrease blood flow.  The stockings should be removed once a day for 1 hour or as instructed by your caregiver. When the stockings are taken off, inspect your legs and feet. Look for:  Open sores.  Red spots.  Puffy areas (swelling).  Anything that does not seem normal. IMPORTANT INFORMATION ABOUT COMPRESSION STOCKINGS  The compression stockings should be clean, dry, and in good condition before you put them on.  Do not put lotion on your legs or feet. This makes it harder to put the stockings on.  Change your stockings immediately if they become wet or soiled.  Do not wear stockings that are ripped or torn.  You may hand-wash or put your stockings in the washing machine. Use cold or warm water with mild detergent. Do not bleach your stockings. They may be air-dried or dried in the dryer on low heat.  If you have pain or have a feeling of "pins and needles" in your feet or legs, you may be wearing stockings that are too tight. Call your caregiver  right away. SEEK IMMEDIATE MEDICAL CARE IF:   You have numbness or tingling in your lower legs that does not get better quickly after the stockings are removed.  Your toes or feet become cold and blue.  You develop open sores or have red spots on your legs that do not go away. MAKE SURE YOU:   Understand these instructions.  Will watch your condition.  Will get help right away if you are not doing well or get worse. Document Released: 01/02/2009 Document Revised: 05/30/2011 Document Reviewed: 01/02/2009 ExitCare Patient Information 2015 [employer]. This information is not intended to replace advice given to you by your health care provider. Make sure you discuss any questions you have with your health care provider.  

## 2013-11-28 NOTE — Progress Notes (Signed)
N & V today with malaise.  Denies any diarrhea.  Episodes of hot/cold sweats and increase in headaches.  Discharge from [employer] area.  Ketones=small  Ph 1.020

## 2013-11-28 NOTE — Telephone Encounter (Signed)
Pt needs script for compression stockings so insurance can be billed for them. Pt req script be [facility] to [facility] [address] in [place] to fax number [phone]

## 2013-11-30 NOTE — Addendum Note (Signed)
Encounter addended by: Hurshel Party, CNM on: 11/30/2013  5:06 PM<BR>     Documentation filed: Problem List

## 2013-12-10 ENCOUNTER — Encounter: Payer: BC Managed Care – PPO | Admitting: Obstetrics & Gynecology

## 2013-12-17 ENCOUNTER — Ambulatory Visit (HOSPITAL_COMMUNITY)
Admission: RE | Admit: 2013-12-17 | Discharge: 2013-12-17 | Disposition: A | Discharge status: Home / Self Care | Payer: BC Managed Care – PPO | Source: Ambulatory Visit | Attending: Obstetrics & Gynecology | Admitting: Obstetrics & Gynecology

## 2013-12-17 ENCOUNTER — Encounter: Payer: BC Managed Care – PPO | Admitting: Obstetrics & Gynecology

## 2013-12-17 DIAGNOSIS — Z3689 Encounter for other specified antenatal screening: Secondary | ICD-10-CM | POA: Diagnosis not present

## 2013-12-17 DIAGNOSIS — Z3492 Encounter for supervision of normal pregnancy, unspecified, second trimester: Secondary | ICD-10-CM

## 2013-12-18 ENCOUNTER — Encounter: Payer: Self-pay | Admitting: Obstetrics & Gynecology

## 2013-12-24 ENCOUNTER — Ambulatory Visit (INDEPENDENT_AMBULATORY_CARE_PROVIDER_SITE_OTHER): Payer: BC Managed Care – PPO | Admitting: Obstetrics & Gynecology

## 2013-12-24 VITALS — BP 117/73 | HR 81 | Wt 252.0 lb

## 2013-12-24 DIAGNOSIS — Z3490 Encounter for supervision of normal pregnancy, unspecified, unspecified trimester: Secondary | ICD-10-CM

## 2013-12-24 NOTE — Progress Notes (Signed)
Routine visit. Good FM. Glucola, labs, tdap at next visit.

## 2013-12-26 ENCOUNTER — Telehealth: Payer: Self-pay

## 2013-12-26 ENCOUNTER — Inpatient Hospital Stay (HOSPITAL_COMMUNITY)
Admission: AD | Admit: 2013-12-26 | Discharge: 2013-12-26 | Disposition: A | Discharge status: Home / Self Care | Payer: BC Managed Care – PPO | Source: Ambulatory Visit | Attending: Obstetrics & Gynecology | Admitting: Obstetrics & Gynecology

## 2013-12-26 ENCOUNTER — Encounter (HOSPITAL_COMMUNITY): Payer: Self-pay | Admitting: *Deleted

## 2013-12-26 DIAGNOSIS — O99712 Diseases of the skin and subcutaneous tissue complicating pregnancy, second trimester: Secondary | ICD-10-CM | POA: Diagnosis present

## 2013-12-26 DIAGNOSIS — L299 Pruritus, unspecified: Secondary | ICD-10-CM | POA: Diagnosis not present

## 2013-12-26 DIAGNOSIS — O26892 Other specified pregnancy related conditions, second trimester: Secondary | ICD-10-CM

## 2013-12-26 DIAGNOSIS — Z3A24 24 weeks gestation of pregnancy: Secondary | ICD-10-CM | POA: Diagnosis not present

## 2013-12-26 DIAGNOSIS — L308 Other specified dermatitis: Secondary | ICD-10-CM

## 2013-12-26 LAB — COMPREHENSIVE METABOLIC PANEL
ALK PHOS: 61 U/L (ref 39–117)
ALT: 16 U/L (ref 0–35)
AST: 16 U/L (ref 0–37)
Albumin: 2.7 g/dL — ABNORMAL LOW (ref 3.5–5.2)
Anion gap: 11 (ref 5–15)
BUN: 4 mg/dL — ABNORMAL LOW (ref 6–23)
CHLORIDE: 103 meq/L (ref 96–112)
CO2: 22 mEq/L (ref 19–32)
CREATININE: 0.47 mg/dL — AB (ref 0.50–1.10)
Calcium: 8.6 mg/dL (ref 8.4–10.5)
GFR calc Af Amer: >90 mL/min (ref >90)
GFR calc non Af Amer: >90 mL/min (ref >90)
Glucose, Bld: 86 mg/dL (ref 70–99)
POTASSIUM: 4.2 meq/L (ref 3.7–5.3)
Sodium: 136 mEq/L — ABNORMAL LOW (ref 137–147)
Total Protein: 6.4 g/dL (ref 6.0–8.3)

## 2013-12-26 LAB — FETAL FIBRONECTIN: FETAL FIBRONECTIN: NEGATIVE

## 2013-12-26 LAB — WET PREP, GENITAL
CLUE CELLS WET PREP: NONE SEEN
Trich, Wet Prep: NONE SEEN
WBC WET PREP: NONE SEEN
Yeast Wet Prep HPF POC: NONE SEEN

## 2013-12-26 LAB — URINALYSIS, ROUTINE W REFLEX MICROSCOPIC
Bilirubin Urine: NEGATIVE
GLUCOSE, UA: NEGATIVE mg/dL
HGB URINE DIPSTICK: NEGATIVE
Ketones, ur: 40 mg/dL — AB
LEUKOCYTES UA: NEGATIVE
Nitrite: NEGATIVE
PH: 6 (ref 5.0–8.0)
PROTEIN: NEGATIVE mg/dL
Specific Gravity, Urine: 1.025 (ref 1.005–1.030)
Urobilinogen, UA: 0.2 mg/dL (ref 0.0–1.0)

## 2013-12-26 MED ORDER — DIPHENHYDRAMINE HCL 25 MG PO CAPS
25.0000 mg | ORAL_CAPSULE | Freq: Once | ORAL | Status: AC
  Administered 2013-12-26: 25 mg via ORAL
  Filled 2013-12-26: qty 1

## 2013-12-26 NOTE — Telephone Encounter (Signed)
Patient walked in office to be seen this morning for a rash. Patient stated she spoke with call-a-nurse and was told to come in at 8am. Spoke with patient and let her know that we do not have a provider on Thursday mornings but we can see her this afternoon. Patient stated she could not do this afternoon. Patient was offered to go to MAU this morning but she did not respond if she would go or not and she walked out of the office.

## 2013-12-26 NOTE — MAU Provider Note (Signed)
Attestation of Attending Supervision of Advanced Practitioner (CNM/NP): Evaluation and management procedures were performed by the Advanced Practitioner under my supervision and collaboration.  I have reviewed the Advanced Practitioner's note and chart, and I agree with the management and plan.  HARRAWAY-SMITH, Willer Osorno 10:16 PM     

## 2013-12-26 NOTE — MAU Provider Note (Signed)
History     CSN: [id number]  Arrival date and time: 12/26/13 [id number]   First Provider Initiated Contact with Patient 12/26/13 1008      Chief Complaint  Patient presents with  . Pruritis   HPI  [name] is a 33 y.o. [id number] at 8329w1d who presents with intense, diffuse, pruritis since 8:30pm yesterday. Patient states that the itching started in her thighs and quickly spread throughout her body, including her palms, soles, and eyes. Was unable to sleep last night because of the pain. No rashes. No new exposures to foods, medications, or detergents. No difficulty breathing. Has not tried any medication for itching. Has not noticed anything that makes itching better or worse. Denies abdominal pain. No nausea or vomiting. No fevers or chills. Patient reports prior cholecystectomy.   Also reports that mucus vaginal discharge this morning while using the bathroom. Denies any large gush of fluid. No bleeding. Denies contractions. Reports good fetal movement.   OB History   Grav Para Term Preterm Abortions TAB SAB Ect Mult Living   6 3 2 1 2  2  1 2       Past Medical History  Diagnosis Date  . History of stomach ulcers   . Vitamin D deficiency   . Anemia     Past Surgical History  Procedure Laterality Date  . Cholecystectomy    . Tonsillectomy and adenoidectomy    . Wisdom tooth extraction    . Bartholin gland cyst excision      Family History  Problem Relation Age of Onset  . Congestive Heart Failure Mother   . Leukemia Father   . Heart disease Father   . Diabetes Father   . Cancer Maternal Grandfather   . Mental illness Paternal Grandmother   . Cancer Paternal Grandfather     History  Substance Use Topics  . Smoking status: Never Smoker   . Smokeless tobacco: Never Used  . Alcohol Use: No    Allergies:  Allergies  Allergen Reactions  . Food     paprika  . Keflex [Cephalexin] Rash    Prescriptions prior to admission  Medication Sig Dispense Refill   . ferrous sulfate 325 (65 FE) MG tablet Take 325 mg by mouth daily with breakfast.      . Magnesium 250 MG TABS Take 250 mg by mouth daily.      . Omega-3 Fatty Acids (FISH OIL) 500 MG CAPS Take by mouth.      . Prenat w/o A-FeCbGl-DSS-FA-DHA (CITRANATAL 90 DHA) 90-1 & 300 MG MISC Take 1 tablet by mouth daily.  30 each  12  . VITAMIN D, CHOLECALCIFEROL, PO Take 50,000 Units by mouth once a week.       Jae Dire. Elastic Bandages & Supports (MEDICAL COMPRESSION STOCKINGS) MISC 1 application by Does not apply route as needed.  1 each  1    Review of Systems  All other systems reviewed and are negative.  Physical Exam   Blood pressure 123/73, pulse 81, temperature 98.2 F (36.8 C), temperature source Oral, height 5\' 6"  (1.676 m), weight 114.034 kg (251 lb 6.4 oz), last menstrual period 07/10/2013.  Physical Exam  Nursing note and vitals reviewed. Constitutional: She is oriented to person, place, and time. She appears well-developed and well-nourished. No distress.  HENT:  Head: Normocephalic and atraumatic.  Neck: Normal range of motion.  Cardiovascular: Normal rate, regular rhythm and normal heart sounds.   No murmur heard. Respiratory: Effort normal and breath sounds normal.  No respiratory distress.  GI: Soft. She exhibits no distension. There is no hepatosplenomegaly. There is no tenderness. There is negative Murphy's sign.  Uterine size appropriate for gestational age  Neurological: She is alert and oriented to person, place, and time.  Skin: Skin is warm and dry. No rash noted.  FHT:  FHR: 140 bpm, variability: moderate,  accelerations:  present,  decelerations:  none    Results for orders placed during the hospital encounter of 12/26/13 (from the past 24 hour(s))  URINALYSIS, ROUTINE W REFLEX MICROSCOPIC     Status: Abnormal   Collection Time    12/26/13  8:40 AM      Result Value Ref Range   Color, Urine YELLOW  YELLOW   APPearance CLEAR  CLEAR   Specific Gravity, Urine 1.025   1.005 - 1.030   pH 6.0  5.0 - 8.0   Glucose, UA NEGATIVE  NEGATIVE mg/dL   Hgb urine dipstick NEGATIVE  NEGATIVE   Bilirubin Urine NEGATIVE  NEGATIVE   Ketones, ur 40 (*) NEGATIVE mg/dL   Protein, ur NEGATIVE  NEGATIVE mg/dL   Urobilinogen, UA 0.2  0.0 - 1.0 mg/dL   Nitrite NEGATIVE  NEGATIVE   Leukocytes, UA NEGATIVE  NEGATIVE  COMPREHENSIVE METABOLIC PANEL     Status: Abnormal   Collection Time    12/26/13 10:28 AM      Result Value Ref Range   Sodium 136 (*) 137 - 147 mEq/L   Potassium 4.2  3.7 - 5.3 mEq/L   Chloride 103  96 - 112 mEq/L   CO2 22  19 - 32 mEq/L   Glucose, Bld 86  70 - 99 mg/dL   BUN 4 (*) 6 - 23 mg/dL   Creatinine, Ser 7.82 (*) 0.50 - 1.10 mg/dL   Calcium 8.6  8.4 - 95.6 mg/dL   Total Protein 6.4  6.0 - 8.3 g/dL   Albumin 2.7 (*) 3.5 - 5.2 g/dL   AST 16  0 - 37 U/L   ALT 16  0 - 35 U/L   Alkaline Phosphatase 61  39 - 117 U/L   Total Bilirubin <0.2 (*) 0.3 - 1.2 mg/dL   GFR calc non Af Amer >90  >90 mL/min   GFR calc Af Amer >90  >90 mL/min   Anion gap 11  5 - 15  FETAL FIBRONECTIN     Status: None   Collection Time    12/26/13 10:34 AM      Result Value Ref Range   Fetal Fibronectin NEGATIVE  NEGATIVE  WET PREP, GENITAL     Status: None   Collection Time    12/26/13 10:34 AM      Result Value Ref Range   Yeast Wet Prep HPF POC NONE SEEN  NONE SEEN   Trich, Wet Prep NONE SEEN  NONE SEEN   Clue Cells Wet Prep HPF POC NONE SEEN  NONE SEEN   WBC, Wet Prep HPF POC NONE SEEN  NONE SEEN      MAU Course  Procedures  MDM 10:35AM: Diffuse pruritis potentially consistent with cholestasis of pregnancy. Will send bile acids, CMP, and give 1 dose of benadryl. Will also obtain fetal fibronectin and wet prep. FHT category I.  Assessment and Plan  A: [redacted]w[redacted]d with diffuse pruritis. Potentially consistent with cholestasis of pregnancy, though presentation is a bit earlier than expected. Serum bile acids pending. No elevated transaminases. fFN negative. Wet prep  negative. FHT category I with no uterine contractions  P: Discharge home in stable condition Recommend OTC benadryl prn itching Follow up as needed if not improving or worsening of symptoms  Brittney Moore 12/26/2013, 12:00 PM   Evaluation and management procedures were performed by Resident physician under my supervision/collaboration. Chart reviewed, patient examined by me and I agree with management and plan.

## 2013-12-26 NOTE — MAU Note (Signed)
Pt reports that intense itching "feels like my nerves are going to explode" all over body that began at about 8pm last night. Also reports that a fair amount of mucus "fell out" this morning while she was using the bathroom.

## 2013-12-26 NOTE — Discharge Instructions (Signed)
Pruritus  °Pruritus is an itch. There are many different problems that can cause an itch. Dry skin is one of the most common causes of itching. Most cases of itching do not require medical attention.  °HOME CARE INSTRUCTIONS  °Make sure your skin is moistened on a regular basis. A moisturizer that contains petroleum jelly is best for keeping moisture in your skin. If you develop a rash, you may try the following for relief:  °· Use corticosteroid cream. °· Apply cool compresses to the affected areas. °· Bathe with Epsom salts or baking soda in the bathwater. °· Soak in colloidal oatmeal baths. These are available at your pharmacy. °· Apply baking soda paste to the rash. Stir water into baking soda until it reaches a paste-like consistency. °· Use an anti-itch lotion. °· Take over-the-counter diphenhydramine medicine by mouth as the instructions direct. °· Avoid scratching. Scratching may cause the rash to become infected. If itching is very bad, your caregiver may suggest prescription lotions or creams to lessen your symptoms. °· Avoid hot showers, which can make itching worse. A cold shower may help with itching as long as you use a moisturizer after the shower. °SEEK MEDICAL CARE IF: °The itching does not go away after several days. °Document Released: 11/17/2010 Document Revised: 07/22/2013 Document Reviewed: 11/17/2010 °ExitCare® Patient Information ©2015 [employer]. This information is not intended to replace advice given to you by your health care provider. Make sure you discuss any questions you have with your health care provider. ° °

## 2013-12-28 LAB — BILE ACIDS, TOTAL: BILE ACIDS TOTAL: 10 umol/L (ref 0–19)

## 2014-01-20 ENCOUNTER — Encounter (HOSPITAL_COMMUNITY): Payer: Self-pay | Admitting: *Deleted

## 2014-01-22 ENCOUNTER — Ambulatory Visit (INDEPENDENT_AMBULATORY_CARE_PROVIDER_SITE_OTHER): Payer: BC Managed Care – PPO | Admitting: Obstetrics & Gynecology

## 2014-01-22 VITALS — BP 130/79 | HR 84 | Wt 256.0 lb

## 2014-01-22 DIAGNOSIS — O9989 Other specified diseases and conditions complicating pregnancy, childbirth and the puerperium: Secondary | ICD-10-CM

## 2014-01-22 DIAGNOSIS — O99213 Obesity complicating pregnancy, third trimester: Secondary | ICD-10-CM

## 2014-01-22 DIAGNOSIS — O99891 Other specified diseases and conditions complicating pregnancy: Secondary | ICD-10-CM

## 2014-01-22 DIAGNOSIS — E669 Obesity, unspecified: Secondary | ICD-10-CM

## 2014-01-22 DIAGNOSIS — O26893 Other specified pregnancy related conditions, third trimester: Secondary | ICD-10-CM

## 2014-01-22 DIAGNOSIS — M549 Dorsalgia, unspecified: Secondary | ICD-10-CM

## 2014-01-22 DIAGNOSIS — Z3493 Encounter for supervision of normal pregnancy, unspecified, third trimester: Secondary | ICD-10-CM

## 2014-01-22 LAB — CBC
HEMATOCRIT: 32.3 % — AB (ref 36.0–46.0)
Hemoglobin: 11 g/dL — ABNORMAL LOW (ref 12.0–15.0)
MCH: 30.1 pg (ref 26.0–34.0)
MCHC: 34.1 g/dL (ref 30.0–36.0)
MCV: 88.3 fL (ref 78.0–100.0)
Platelets: 212 10*3/uL (ref 150–400)
RBC: 3.66 MIL/uL — ABNORMAL LOW (ref 3.87–5.11)
RDW: 13.5 % (ref 11.5–15.5)
WBC: 11 10*3/uL — ABNORMAL HIGH (ref 4.0–10.5)

## 2014-01-22 NOTE — Progress Notes (Signed)
Pt c/o back pain, pelvic pressure, and pain at public symphysis.  Pt hs trouble standing up.  Pt has reproducible pain for sympyhsis and In RLQ c/w RLP.  Suggested pregnancy belt, PT, and chiropracter.  Pt will try all 3.   Given maternal obesity and not able to follow fundal heights well, I suggest one US for growth.  This will be ordered today.  Pt left before order placed.

## 2014-01-22 NOTE — Progress Notes (Signed)
Pt states she has increasing "pelvic floor pain thorough out the day"

## 2014-01-23 ENCOUNTER — Telehealth: Payer: Self-pay | Admitting: *Deleted

## 2014-01-23 ENCOUNTER — Other Ambulatory Visit: Payer: Self-pay | Admitting: *Deleted

## 2014-01-23 DIAGNOSIS — R7309 Other abnormal glucose: Secondary | ICD-10-CM

## 2014-01-23 LAB — GLUCOSE TOLERANCE, 1 HOUR (50G) W/O FASTING: GLUCOSE 1 HOUR GTT: 164 mg/dL — AB (ref 70–140)

## 2014-01-23 LAB — HIV ANTIBODY (ROUTINE TESTING W REFLEX): HIV 1&2 Ab, 4th Generation: NONREACTIVE

## 2014-01-23 LAB — RPR

## 2014-01-23 NOTE — Telephone Encounter (Signed)
LMOM for pt tp rtn call about setting appt for 3 Hr GTT - abnormal 1 Hr GTT.

## 2014-02-04 ENCOUNTER — Ambulatory Visit (HOSPITAL_COMMUNITY)
Admission: RE | Admit: 2014-02-04 | Discharge: 2014-02-04 | Disposition: A | Discharge status: Home / Self Care | Payer: BC Managed Care – PPO | Source: Ambulatory Visit | Attending: Obstetrics & Gynecology | Admitting: Obstetrics & Gynecology

## 2014-02-04 DIAGNOSIS — O09293 Supervision of pregnancy with other poor reproductive or obstetric history, third trimester: Secondary | ICD-10-CM | POA: Diagnosis not present

## 2014-02-04 DIAGNOSIS — Z3A29 29 weeks gestation of pregnancy: Secondary | ICD-10-CM | POA: Insufficient documentation

## 2014-02-04 DIAGNOSIS — O99210 Obesity complicating pregnancy, unspecified trimester: Secondary | ICD-10-CM | POA: Insufficient documentation

## 2014-02-04 DIAGNOSIS — O99213 Obesity complicating pregnancy, third trimester: Secondary | ICD-10-CM | POA: Diagnosis not present

## 2014-02-04 DIAGNOSIS — O09219 Supervision of pregnancy with history of pre-term labor, unspecified trimester: Secondary | ICD-10-CM

## 2014-02-04 DIAGNOSIS — O09899 Supervision of other high risk pregnancies, unspecified trimester: Secondary | ICD-10-CM | POA: Insufficient documentation

## 2014-02-06 ENCOUNTER — Ambulatory Visit (INDEPENDENT_AMBULATORY_CARE_PROVIDER_SITE_OTHER): Payer: BC Managed Care – PPO | Admitting: Obstetrics & Gynecology

## 2014-02-06 ENCOUNTER — Encounter: Payer: Self-pay | Admitting: Obstetrics & Gynecology

## 2014-02-06 ENCOUNTER — Encounter: Payer: Self-pay | Admitting: *Deleted

## 2014-02-06 ENCOUNTER — Ambulatory Visit (INDEPENDENT_AMBULATORY_CARE_PROVIDER_SITE_OTHER): Payer: BC Managed Care – PPO | Admitting: Physical Therapy

## 2014-02-06 VITALS — BP 120/79 | HR 86 | Wt 257.0 lb

## 2014-02-06 DIAGNOSIS — M6281 Muscle weakness (generalized): Secondary | ICD-10-CM

## 2014-02-06 DIAGNOSIS — Z3483 Encounter for supervision of other normal pregnancy, third trimester: Secondary | ICD-10-CM

## 2014-02-06 DIAGNOSIS — O24919 Unspecified diabetes mellitus in pregnancy, unspecified trimester: Secondary | ICD-10-CM | POA: Insufficient documentation

## 2014-02-06 DIAGNOSIS — M549 Dorsalgia, unspecified: Secondary | ICD-10-CM

## 2014-02-06 DIAGNOSIS — R5381 Other malaise: Secondary | ICD-10-CM

## 2014-02-06 DIAGNOSIS — O26893 Other specified pregnancy related conditions, third trimester: Secondary | ICD-10-CM

## 2014-02-06 NOTE — Progress Notes (Signed)
She refuses to do the 3 hour GTT.

## 2014-02-06 NOTE — Progress Notes (Signed)
I reviewed her BG tests, it appears 3 hr GTT in August was abnl with both FBS and 2 hr elevated. Will receive DM teaching for diet and QID BG testing, RTC 2 weeks  US 11/17 EFW > 90%ile, breech

## 2014-02-06 NOTE — Patient Instructions (Signed)
Third Trimester of Pregnancy The third trimester is from week 29 through week 42, months 7 through 9. The third trimester is a time when the fetus is growing rapidly. At the end of the ninth month, the fetus is about 20 inches in length and weighs 6-10 pounds.  BODY CHANGES Your body goes through many changes during pregnancy. The changes vary from woman to woman.   Your weight will continue to increase. You can expect to gain 25-35 pounds (11-16 kg) by the end of the pregnancy.  You may begin to get stretch marks on your hips, abdomen, and breasts.  You may urinate more often because the fetus is moving lower into your pelvis and pressing on your bladder.  You may develop or continue to have heartburn as a result of your pregnancy.  You may develop constipation because certain hormones are causing the muscles that push waste through your intestines to slow down.  You may develop hemorrhoids or swollen, bulging veins (varicose veins).  You may have pelvic pain because of the weight gain and pregnancy hormones relaxing your joints between the bones in your pelvis. Backaches may result from overexertion of the muscles supporting your posture.  You may have changes in your hair. These can include thickening of your hair, rapid growth, and changes in texture. Some women also have hair loss during or after pregnancy, or hair that feels dry or thin. Your hair will most likely return to normal after your baby is born.  Your breasts will continue to grow and be tender. A yellow discharge may leak from your breasts called colostrum.  Your belly button may stick out.  You may feel short of breath because of your expanding uterus.  You may notice the fetus "dropping," or moving lower in your abdomen.  You may have a bloody mucus discharge. This usually occurs a few days to a week before labor begins.  Your cervix becomes thin and soft (effaced) near your due date. WHAT TO EXPECT AT YOUR PRENATAL  EXAMS  You will have prenatal exams every 2 weeks until week 36. Then, you will have weekly prenatal exams. During a routine prenatal visit:  You will be weighed to make sure you and the fetus are growing normally.  Your blood pressure is taken.  Your abdomen will be measured to track your baby's growth.  The fetal heartbeat will be listened to.  Any test results from the previous visit will be discussed.  You may have a cervical check near your due date to see if you have effaced. At around 36 weeks, your caregiver will check your cervix. At the same time, your caregiver will also perform a test on the secretions of the vaginal tissue. This test is to determine if a type of bacteria, Group B streptococcus, is present. Your caregiver will explain this further. Your caregiver may ask you:  What your birth plan is.  How you are feeling.  If you are feeling the baby move.  If you have had any abnormal symptoms, such as leaking fluid, bleeding, severe headaches, or abdominal cramping.  If you have any questions. Other tests or screenings that may be performed during your third trimester include:  Blood tests that check for low iron levels (anemia).  Fetal testing to check the health, activity level, and growth of the fetus. Testing is done if you have certain medical conditions or if there are problems during the pregnancy. FALSE LABOR You may feel small, irregular contractions that   eventually go away. These are called Braxton Hicks contractions, or false labor. Contractions may last for hours, days, or even weeks before true labor sets in. If contractions come at regular intervals, intensify, or become painful, it is best to be seen by your caregiver.  SIGNS OF LABOR   Menstrual-like cramps.  Contractions that are 5 minutes apart or less.  Contractions that start on the top of the uterus and spread down to the lower abdomen and back.  A sense of increased pelvic pressure or back  pain.  A watery or bloody mucus discharge that comes from the vagina. If you have any of these signs before the 37th week of pregnancy, call your caregiver right away. You need to go to the hospital to get checked immediately. HOME CARE INSTRUCTIONS   Avoid all smoking, herbs, alcohol, and unprescribed drugs. These chemicals affect the formation and growth of the baby.  Follow your caregiver's instructions regarding medicine use. There are medicines that are either safe or unsafe to take during pregnancy.  Exercise only as directed by your caregiver. Experiencing uterine cramps is a good sign to stop exercising.  Continue to eat regular, healthy meals.  Wear a good support bra for breast tenderness.  Do not use hot tubs, steam rooms, or saunas.  Wear your seat belt at all times when driving.  Avoid raw meat, uncooked cheese, cat litter boxes, and soil used by cats. These carry germs that can cause birth defects in the baby.  Take your prenatal vitamins.  Try taking a stool softener (if your caregiver approves) if you develop constipation. Eat more high-fiber foods, such as fresh vegetables or fruit and whole grains. Drink plenty of fluids to keep your urine clear or pale yellow.  Take warm sitz baths to soothe any pain or discomfort caused by hemorrhoids. Use hemorrhoid cream if your caregiver approves.  If you develop varicose veins, wear support hose. Elevate your feet for 15 minutes, 3-4 times a day. Limit salt in your diet.  Avoid heavy lifting, wear low heal shoes, and practice good posture.  Rest a lot with your legs elevated if you have leg cramps or low back pain.  Visit your dentist if you have not gone during your pregnancy. Use a soft toothbrush to brush your teeth and be gentle when you floss.  A sexual relationship may be continued unless your caregiver directs you otherwise.  Do not travel far distances unless it is absolutely necessary and only with the approval  of your caregiver.  Take prenatal classes to understand, practice, and ask questions about the labor and delivery.  Make a trial run to the hospital.  Pack your hospital bag.  Prepare the baby's nursery.  Continue to go to all your prenatal visits as directed by your caregiver. SEEK MEDICAL CARE IF:  You are unsure if you are in labor or if your water has broken.  You have dizziness.  You have mild pelvic cramps, pelvic pressure, or nagging pain in your abdominal area.  You have persistent nausea, vomiting, or diarrhea.  You have a bad smelling vaginal discharge.  You have pain with urination. SEEK IMMEDIATE MEDICAL CARE IF:   You have a fever.  You are leaking fluid from your vagina.  You have spotting or bleeding from your vagina.  You have severe abdominal cramping or pain.  You have rapid weight loss or gain.  You have shortness of breath with chest pain.  You notice sudden or extreme swelling   of your face, hands, ankles, feet, or legs.  You have not felt your baby move in over an hour.  You have severe headaches that do not go away with medicine.  You have vision changes. Document Released: 03/01/2001 Document Revised: 03/12/2013 Document Reviewed: 05/08/2012 ExitCare Patient Information 2015 [employer]. This information is not intended to replace advice given to you by your health care provider. Make sure you discuss any questions you have with your health care provider.  

## 2014-02-11 ENCOUNTER — Encounter: Payer: Self-pay | Admitting: Obstetrics & Gynecology

## 2014-02-17 ENCOUNTER — Ambulatory Visit (INDEPENDENT_AMBULATORY_CARE_PROVIDER_SITE_OTHER): Payer: BC Managed Care – PPO | Admitting: Advanced Practice Midwife

## 2014-02-17 VITALS — BP 116/77 | HR 81 | Wt 256.0 lb

## 2014-02-17 DIAGNOSIS — Z3483 Encounter for supervision of other normal pregnancy, third trimester: Secondary | ICD-10-CM

## 2014-02-17 DIAGNOSIS — O3663X1 Maternal care for excessive fetal growth, third trimester, fetus 1: Secondary | ICD-10-CM

## 2014-02-17 MED ORDER — RANITIDINE HCL 150 MG PO TABS: 150.0000 mg | ORAL_TABLET | Freq: Two times a day (BID) | ORAL | Status: DC

## 2014-02-17 NOTE — Progress Notes (Signed)
Doing well.  Good fetal movement, denies vaginal bleeding, regular contractions.  Reports leakage of fluid occurred when she woke up this morning, small puddle on bed, did not smell like urine.  Pelvic exam with negative pooling, negative nitrazine and negative ferning.  PTL precautions given.  Pt has diabetic class scheduled on Wednesday to begin glucose testing.  Heartburn daily, taking Tums.  Zantac 150 mg BID sent to pharmacy.  Discussed recommendation for twice weekly testing r/t previous fetal loss. Pt to discuss with husband and call office.  F/U u/s for growth ordered. Reviewed suggested ways to turn breech baby, including noninvasive techniques (ice on abdomen, music played softly at feet, maternal positioning) as well as scheduled external version.  Discussed external version may not be an option with macrosomia or other complications including GDM.

## 2014-02-17 NOTE — Progress Notes (Signed)
Feels like she may be leaking fluid.  Ketones-mod

## 2014-02-18 ENCOUNTER — Encounter: Payer: BC Managed Care – PPO | Admitting: Physician Assistant

## 2014-02-19 ENCOUNTER — Encounter: Payer: BLUE CROSS/BLUE SHIELD | Attending: Obstetrics & Gynecology

## 2014-02-19 VITALS — Ht 66.0 in | Wt 257.4 lb

## 2014-02-19 DIAGNOSIS — O24919 Unspecified diabetes mellitus in pregnancy, unspecified trimester: Secondary | ICD-10-CM | POA: Diagnosis present

## 2014-02-19 DIAGNOSIS — Z713 Dietary counseling and surveillance: Secondary | ICD-10-CM | POA: Insufficient documentation

## 2014-02-20 ENCOUNTER — Telehealth: Payer: Self-pay | Admitting: *Deleted

## 2014-02-20 DIAGNOSIS — O24419 Gestational diabetes mellitus in pregnancy, unspecified control: Secondary | ICD-10-CM

## 2014-02-20 MED ORDER — GLUCOSE BLOOD VI STRP: ORAL_STRIP | Status: DC

## 2014-02-20 MED ORDER — ACCU-CHEK FASTCLIX LANCETS MISC: 1.0000 [IU] | Freq: Four times a day (QID) | Status: DC

## 2014-02-20 NOTE — Telephone Encounter (Signed)
Pt called in adv she was issued Accu-Chek Nano and needs Strips and Lancets called in.

## 2014-02-20 NOTE — Telephone Encounter (Signed)
Called in glucose strips

## 2014-02-24 ENCOUNTER — Encounter: Payer: BC Managed Care – PPO | Admitting: Physical Therapy

## 2014-02-24 NOTE — Progress Notes (Signed)
  Patient was seen on 02/19/14 for Gestational Diabetes self-management . The following learning objectives were met by the patient :   States the definition of Gestational Diabetes  States why dietary management is important in controlling blood glucose  Describes the effects of carbohydrates on blood glucose levels  Demonstrates ability to create a balanced meal plan  Demonstrates carbohydrate counting   States when to check blood glucose levels  Demonstrates proper blood glucose monitoring techniques  States the effect of stress and exercise on blood glucose levels  States the importance of limiting caffeine and abstaining from alcohol and smoking  Plan:  Aim for 2 Carb Choices per meal (30 grams) +/- 1 either way for breakfast Aim for 3 Carb Choices per meal (45 grams) +/- 1 either way from lunch and dinner Aim for 1-2 Carbs per snack Begin reading food labels for Total Carbohydrate and sugar grams of foods Consider  increasing your activity level by walking daily as tolerated Begin checking BG before breakfast and 2 hours after first bit of breakfast, lunch and dinner after  as directed by MD  Take medication  as directed by MD  Patient instructed to monitor glucose levels: FBS: 60 - <90 2 hour: <120  Patient received the following handouts:  Nutrition Diabetes and Pregnancy  Carbohydrate Counting List  Meal Planning worksheet  Patient will be seen for follow-up as needed.

## 2014-02-25 ENCOUNTER — Telehealth: Payer: Self-pay | Admitting: *Deleted

## 2014-02-25 DIAGNOSIS — O24410 Gestational diabetes mellitus in pregnancy, diet controlled: Secondary | ICD-10-CM

## 2014-02-25 MED ORDER — ONETOUCH BASIC SYSTEM W/DEVICE KIT: 1.0000 | PACK | Freq: Once | Status: DC

## 2014-02-25 NOTE — Telephone Encounter (Signed)
Rcvd fax from [facility] stating Accu-Check test strips were not covered. Notice had preferred devices listed. Sent new script to pharmacy for [employer].

## 2014-03-04 ENCOUNTER — Encounter: Payer: Self-pay | Admitting: *Deleted

## 2014-03-04 ENCOUNTER — Ambulatory Visit (INDEPENDENT_AMBULATORY_CARE_PROVIDER_SITE_OTHER): Payer: BC Managed Care – PPO | Admitting: Obstetrics & Gynecology

## 2014-03-04 VITALS — BP 122/77 | HR 72 | Wt 256.0 lb

## 2014-03-04 DIAGNOSIS — E669 Obesity, unspecified: Secondary | ICD-10-CM

## 2014-03-04 DIAGNOSIS — O24419 Gestational diabetes mellitus in pregnancy, unspecified control: Secondary | ICD-10-CM

## 2014-03-04 DIAGNOSIS — Z3493 Encounter for supervision of normal pregnancy, unspecified, third trimester: Secondary | ICD-10-CM

## 2014-03-04 DIAGNOSIS — O99213 Obesity complicating pregnancy, third trimester: Secondary | ICD-10-CM

## 2014-03-04 MED ORDER — GLUCOSE BLOOD VI STRP: ORAL_STRIP | Status: DC

## 2014-03-04 MED ORDER — METFORMIN HCL 500 MG PO TABS: ORAL_TABLET | ORAL | Status: DC

## 2014-03-04 MED ORDER — ONETOUCH DELICA LANCETS 33G MISC: 1.0000 [IU] | Freq: Four times a day (QID) | Status: DC

## 2014-03-04 NOTE — Progress Notes (Signed)
Routine visit. Good FM. No VB, ROM, or contractions. She is very concerned that her "pelvic dysfunction" has not been addressed. (She is referring to her pelvic/symphysis pain). She has not yet found a belly band that she wants to purchase. I have offered chiropractor versus orthopedist. She prefers a chiropractor at this time. She is concerned that the baby is breech. I have stressed that we are not concerned about the fetal lie until 36 weeks at the earliest. She has a follow up u/s for growth tomorrow at [facility]. Her fbs are mostly all above 90. Her 2 hour post prandials are mostly less than 120. I have recommended starting metformin 500 mg qhs. She is willing to try this. Cultures at next visit.

## 2014-03-04 NOTE — Progress Notes (Signed)
Pt was put on Metformin today due to uncontrolled diabetes with diet.  She is aware that she needs twice weekly NST's.  She was counseled on the rational for the NST's, ie: risk of stillbirth, placental compromise.  Pt states that she cannot come twice weekly due to her work schedule.  A letter was written on her behalf to her employer stating the need for biweekly NST's due to high risk pregnancy.  She is scheduled for this Friday morning and pt will let us know about future appt.

## 2014-03-04 NOTE — Progress Notes (Signed)
Pt is concerned that pelvic pain ("PSD") has not been addressed

## 2014-03-05 ENCOUNTER — Ambulatory Visit (HOSPITAL_COMMUNITY)
Admission: RE | Admit: 2014-03-05 | Discharge: 2014-03-05 | Disposition: A | Discharge status: Home / Self Care | Payer: BC Managed Care – PPO | Source: Ambulatory Visit | Attending: Advanced Practice Midwife | Admitting: Advanced Practice Midwife

## 2014-03-05 DIAGNOSIS — O3663X0 Maternal care for excessive fetal growth, third trimester, not applicable or unspecified: Secondary | ICD-10-CM | POA: Diagnosis not present

## 2014-03-05 DIAGNOSIS — Z3A34 34 weeks gestation of pregnancy: Secondary | ICD-10-CM | POA: Diagnosis not present

## 2014-03-05 DIAGNOSIS — O99213 Obesity complicating pregnancy, third trimester: Secondary | ICD-10-CM | POA: Insufficient documentation

## 2014-03-05 DIAGNOSIS — O3663X1 Maternal care for excessive fetal growth, third trimester, fetus 1: Secondary | ICD-10-CM

## 2014-03-07 ENCOUNTER — Ambulatory Visit (INDEPENDENT_AMBULATORY_CARE_PROVIDER_SITE_OTHER): Payer: BC Managed Care – PPO | Admitting: *Deleted

## 2014-03-07 ENCOUNTER — Encounter: Payer: Self-pay | Admitting: Obstetrics & Gynecology

## 2014-03-07 VITALS — BP 116/69 | HR 82 | Wt 256.0 lb

## 2014-03-07 DIAGNOSIS — Z3A34 34 weeks gestation of pregnancy: Secondary | ICD-10-CM

## 2014-03-07 DIAGNOSIS — O24414 Gestational diabetes mellitus in pregnancy, insulin controlled: Secondary | ICD-10-CM | POA: Diagnosis not present

## 2014-03-07 DIAGNOSIS — O24419 Gestational diabetes mellitus in pregnancy, unspecified control: Secondary | ICD-10-CM

## 2014-03-12 ENCOUNTER — Ambulatory Visit (INDEPENDENT_AMBULATORY_CARE_PROVIDER_SITE_OTHER): Payer: BC Managed Care – PPO | Admitting: *Deleted

## 2014-03-12 VITALS — BP 122/66 | HR 79 | Wt 258.0 lb

## 2014-03-12 DIAGNOSIS — O24419 Gestational diabetes mellitus in pregnancy, unspecified control: Secondary | ICD-10-CM

## 2014-03-18 ENCOUNTER — Ambulatory Visit (INDEPENDENT_AMBULATORY_CARE_PROVIDER_SITE_OTHER): Payer: BC Managed Care – PPO | Admitting: Obstetrics & Gynecology

## 2014-03-18 VITALS — BP 112/68 | HR 75 | Wt 258.0 lb

## 2014-03-18 DIAGNOSIS — O24419 Gestational diabetes mellitus in pregnancy, unspecified control: Secondary | ICD-10-CM

## 2014-03-18 DIAGNOSIS — Z23 Encounter for immunization: Secondary | ICD-10-CM

## 2014-03-18 DIAGNOSIS — Z3493 Encounter for supervision of normal pregnancy, unspecified, third trimester: Secondary | ICD-10-CM

## 2014-03-18 MED ORDER — TETANUS-DIPHTH-ACELL PERTUSSIS 5-2.5-18.5 LF-MCG/0.5 IM SUSP
0.5000 mL | Freq: Once | INTRAMUSCULAR | Status: AC
  Administered 2014-03-18: 0.5 mL via INTRAMUSCULAR

## 2014-03-18 NOTE — Progress Notes (Signed)
fastings all nml on metformin.  Some mild pp elevations through christmas but overall doing well. Macrosomia on US.  Discussed primary c/s at 4500 grams.  Pt wanted to be induced at 37 weeks to avoid c/s but understands this is not an option due to risk of fetal prematurity. F/U growth at 38 1/2 weeks Needs twice weekly testing to include NST twice a week and AFI once a week.  Given holiday weekend, suggest BPP this week since office is closed.

## 2014-03-20 ENCOUNTER — Ambulatory Visit (HOSPITAL_COMMUNITY)
Admission: RE | Admit: 2014-03-20 | Discharge: 2014-03-20 | Disposition: A | Discharge status: Home / Self Care | Payer: BLUE CROSS/BLUE SHIELD | Source: Ambulatory Visit | Attending: Obstetrics & Gynecology | Admitting: Obstetrics & Gynecology

## 2014-03-20 DIAGNOSIS — O24419 Gestational diabetes mellitus in pregnancy, unspecified control: Secondary | ICD-10-CM | POA: Diagnosis present

## 2014-03-20 DIAGNOSIS — Z3A36 36 weeks gestation of pregnancy: Secondary | ICD-10-CM | POA: Diagnosis not present

## 2014-03-20 DIAGNOSIS — O3663X3 Maternal care for excessive fetal growth, third trimester, fetus 3: Secondary | ICD-10-CM | POA: Insufficient documentation

## 2014-03-20 DIAGNOSIS — Z8751 Personal history of pre-term labor: Secondary | ICD-10-CM | POA: Insufficient documentation

## 2014-03-20 DIAGNOSIS — Z3493 Encounter for supervision of normal pregnancy, unspecified, third trimester: Secondary | ICD-10-CM

## 2014-03-20 DIAGNOSIS — O99213 Obesity complicating pregnancy, third trimester: Secondary | ICD-10-CM | POA: Insufficient documentation

## 2014-03-20 DIAGNOSIS — O99210 Obesity complicating pregnancy, unspecified trimester: Secondary | ICD-10-CM | POA: Diagnosis present

## 2014-03-20 DIAGNOSIS — O3663X0 Maternal care for excessive fetal growth, third trimester, not applicable or unspecified: Secondary | ICD-10-CM | POA: Diagnosis present

## 2014-03-21 NOTE — L&D Delivery Note (Signed)
Operative Delivery Note At 5:53 PM a viable and healthy female was delivered via Vaginal, Spontaneous Delivery.  Presentation: vertex; Position: Left,, Occiput,, Anterior  First maneuver: McRoberts Second maneuver:Suprapubic pressure Total time: 50 seconds   Verbal consent: obtained from patient.  APGAR: 9, 9; weight pending .   Placenta status: Intact, Spontaneous.   Cord: 3 vessels with the following complications: None.    Anesthesia: None  Episiotomy: None Lacerations:  None Suture Repair: n/a Est. Blood Loss (mL): 300  Mom to postpartum.  Baby to Couplet care / Skin to Skin.  LEFTWICH-KIRBY, LISA 04/02/2014, 6:26 PM

## 2014-03-22 DIAGNOSIS — Z3A36 36 weeks gestation of pregnancy: Secondary | ICD-10-CM | POA: Insufficient documentation

## 2014-03-22 DIAGNOSIS — O24419 Gestational diabetes mellitus in pregnancy, unspecified control: Secondary | ICD-10-CM | POA: Insufficient documentation

## 2014-03-25 ENCOUNTER — Encounter: Payer: Self-pay | Admitting: Obstetrics & Gynecology

## 2014-03-25 ENCOUNTER — Other Ambulatory Visit: Payer: Self-pay | Admitting: Obstetrics & Gynecology

## 2014-03-25 ENCOUNTER — Ambulatory Visit (INDEPENDENT_AMBULATORY_CARE_PROVIDER_SITE_OTHER): Payer: BLUE CROSS/BLUE SHIELD | Admitting: Obstetrics & Gynecology

## 2014-03-25 ENCOUNTER — Ambulatory Visit (HOSPITAL_COMMUNITY): Payer: BC Managed Care – PPO

## 2014-03-25 VITALS — BP 119/72 | HR 72 | Wt 257.0 lb

## 2014-03-25 DIAGNOSIS — Z3493 Encounter for supervision of normal pregnancy, unspecified, third trimester: Secondary | ICD-10-CM

## 2014-03-25 DIAGNOSIS — O24410 Gestational diabetes mellitus in pregnancy, diet controlled: Secondary | ICD-10-CM

## 2014-03-25 DIAGNOSIS — Z36 Encounter for antenatal screening of mother: Secondary | ICD-10-CM

## 2014-03-25 LAB — OB RESULTS CONSOLE GC/CHLAMYDIA
CHLAMYDIA, DNA PROBE: NEGATIVE
GC PROBE AMP, GENITAL: NEGATIVE

## 2014-03-25 LAB — OB RESULTS CONSOLE GBS: GBS: NEGATIVE

## 2014-03-25 NOTE — Progress Notes (Signed)
Routine visit. Good FM. No problems. Cervical cultures today. Excellent sugar control. Excellent pelvis. EFW today about 7 pounds. Cervix anterior with soft consistency.

## 2014-03-26 LAB — GC/CHLAMYDIA PROBE AMP
CT Probe RNA: NEGATIVE
GC PROBE AMP APTIMA: NEGATIVE

## 2014-03-27 LAB — CULTURE, BETA STREP (GROUP B ONLY)

## 2014-03-28 ENCOUNTER — Ambulatory Visit (INDEPENDENT_AMBULATORY_CARE_PROVIDER_SITE_OTHER): Payer: BLUE CROSS/BLUE SHIELD | Admitting: *Deleted

## 2014-03-28 ENCOUNTER — Encounter: Payer: Self-pay | Admitting: Obstetrics & Gynecology

## 2014-03-28 VITALS — BP 120/65 | HR 79 | Wt 257.0 lb

## 2014-03-28 DIAGNOSIS — O24410 Gestational diabetes mellitus in pregnancy, diet controlled: Secondary | ICD-10-CM | POA: Diagnosis not present

## 2014-04-01 ENCOUNTER — Ambulatory Visit (INDEPENDENT_AMBULATORY_CARE_PROVIDER_SITE_OTHER): Payer: BLUE CROSS/BLUE SHIELD | Admitting: Obstetrics & Gynecology

## 2014-04-01 ENCOUNTER — Encounter: Payer: Self-pay | Admitting: Obstetrics & Gynecology

## 2014-04-01 VITALS — BP 114/79 | HR 71

## 2014-04-01 DIAGNOSIS — Z3483 Encounter for supervision of other normal pregnancy, third trimester: Secondary | ICD-10-CM

## 2014-04-01 DIAGNOSIS — O24113 Pre-existing diabetes mellitus, type 2, in pregnancy, third trimester: Secondary | ICD-10-CM

## 2014-04-01 NOTE — Progress Notes (Signed)
Routine visit. Good FM. NSTR. Labor precautions. Schedule u/s for growth. Excellent sugars. Discussed IOL and her concerns.

## 2014-04-02 ENCOUNTER — Encounter (HOSPITAL_COMMUNITY): Payer: Self-pay | Admitting: General Practice

## 2014-04-02 ENCOUNTER — Inpatient Hospital Stay (HOSPITAL_COMMUNITY)
Admission: AD | Admit: 2014-04-02 | Discharge: 2014-04-04 | DRG: 775 | Disposition: A | Discharge status: Home / Self Care | Payer: BLUE CROSS/BLUE SHIELD | Source: Ambulatory Visit | Attending: Obstetrics and Gynecology | Admitting: Obstetrics and Gynecology

## 2014-04-02 DIAGNOSIS — Z9102 Food additives allergy status: Secondary | ICD-10-CM

## 2014-04-02 DIAGNOSIS — O24429 Gestational diabetes mellitus in childbirth, unspecified control: Principal | ICD-10-CM | POA: Diagnosis present

## 2014-04-02 DIAGNOSIS — Z833 Family history of diabetes mellitus: Secondary | ICD-10-CM

## 2014-04-02 DIAGNOSIS — Z6841 Body Mass Index (BMI) 40.0 and over, adult: Secondary | ICD-10-CM | POA: Diagnosis not present

## 2014-04-02 DIAGNOSIS — O99214 Obesity complicating childbirth: Secondary | ICD-10-CM | POA: Diagnosis present

## 2014-04-02 DIAGNOSIS — IMO0001 Reserved for inherently not codable concepts without codable children: Secondary | ICD-10-CM

## 2014-04-02 DIAGNOSIS — Z3A38 38 weeks gestation of pregnancy: Secondary | ICD-10-CM | POA: Diagnosis present

## 2014-04-02 DIAGNOSIS — O09291 Supervision of pregnancy with other poor reproductive or obstetric history, first trimester: Secondary | ICD-10-CM

## 2014-04-02 DIAGNOSIS — O99213 Obesity complicating pregnancy, third trimester: Secondary | ICD-10-CM

## 2014-04-02 DIAGNOSIS — O09293 Supervision of pregnancy with other poor reproductive or obstetric history, third trimester: Secondary | ICD-10-CM | POA: Diagnosis present

## 2014-04-02 DIAGNOSIS — Z881 Allergy status to other antibiotic agents status: Secondary | ICD-10-CM

## 2014-04-02 HISTORY — DX: Gestational diabetes mellitus in pregnancy, unspecified control: O24.419

## 2014-04-02 LAB — CBC
HCT: 37.2 % (ref 36.0–46.0)
Hemoglobin: 12.3 g/dL (ref 12.0–15.0)
MCH: 30.6 pg (ref 26.0–34.0)
MCHC: 33.1 g/dL (ref 30.0–36.0)
MCV: 92.5 fL (ref 78.0–100.0)
PLATELETS: 170 10*3/uL (ref 150–400)
RBC: 4.02 MIL/uL (ref 3.87–5.11)
RDW: 13.8 % (ref 11.5–15.5)
WBC: 14.4 10*3/uL — ABNORMAL HIGH (ref 4.0–10.5)

## 2014-04-02 LAB — TYPE AND SCREEN
ABO/RH(D): O POS
Antibody Screen: NEGATIVE

## 2014-04-02 MED ORDER — ONDANSETRON HCL 4 MG/2ML IJ SOLN: 4.0000 mg | Freq: Four times a day (QID) | INTRAMUSCULAR | Status: DC | PRN

## 2014-04-02 MED ORDER — FLEET ENEMA 7-19 GM/118ML RE ENEM: 1.0000 | ENEMA | RECTAL | Status: DC | PRN

## 2014-04-02 MED ORDER — ACETAMINOPHEN 325 MG PO TABS: 650.0000 mg | ORAL_TABLET | ORAL | Status: DC | PRN

## 2014-04-02 MED ORDER — PRENATAL MULTIVITAMIN CH: 1.0000 | ORAL_TABLET | Freq: Every day | ORAL | Status: DC

## 2014-04-02 MED ORDER — LACTATED RINGERS IV SOLN: 500.0000 mL | INTRAVENOUS | Status: DC | PRN

## 2014-04-02 MED ORDER — PSEUDOEPHEDRINE HCL 30 MG PO TABS
30.0000 mg | ORAL_TABLET | Freq: Four times a day (QID) | ORAL | Status: DC | PRN
  Administered 2014-04-03: 60 mg via ORAL
  Filled 2014-04-02 (×2): qty 2

## 2014-04-02 MED ORDER — ZOLPIDEM TARTRATE 5 MG PO TABS: 5.0000 mg | ORAL_TABLET | Freq: Every evening | ORAL | Status: DC | PRN

## 2014-04-02 MED ORDER — FAMOTIDINE 20 MG PO TABS: 20.0000 mg | ORAL_TABLET | Freq: Every day | ORAL | Status: DC

## 2014-04-02 MED ORDER — CITRIC ACID-SODIUM CITRATE 334-500 MG/5ML PO SOLN: 30.0000 mL | ORAL | Status: DC | PRN

## 2014-04-02 MED ORDER — LACTATED RINGERS IV SOLN
INTRAVENOUS | Status: DC
Start: 2014-04-02 — End: 2014-04-02

## 2014-04-02 MED ORDER — OXYCODONE-ACETAMINOPHEN 5-325 MG PO TABS: 1.0000 | ORAL_TABLET | ORAL | Status: DC | PRN

## 2014-04-02 MED ORDER — LIDOCAINE HCL (PF) 1 % IJ SOLN
30.0000 mL | INTRAMUSCULAR | Status: DC | PRN
  Filled 2014-04-02: qty 30

## 2014-04-02 MED ORDER — LANOLIN HYDROUS EX OINT: TOPICAL_OINTMENT | CUTANEOUS | Status: DC | PRN

## 2014-04-02 MED ORDER — TETANUS-DIPHTH-ACELL PERTUSSIS 5-2.5-18.5 LF-MCG/0.5 IM SUSP: 0.5000 mL | Freq: Once | INTRAMUSCULAR | Status: DC

## 2014-04-02 MED ORDER — BENZOCAINE-MENTHOL 20-0.5 % EX AERO
1.0000 "application " | INHALATION_SPRAY | CUTANEOUS | Status: DC | PRN
  Administered 2014-04-02: 1 via TOPICAL
  Filled 2014-04-02: qty 56

## 2014-04-02 MED ORDER — IBUPROFEN 600 MG PO TABS
600.0000 mg | ORAL_TABLET | Freq: Four times a day (QID) | ORAL | Status: DC
  Administered 2014-04-03 – 2014-04-04 (×7): 600 mg via ORAL
  Filled 2014-04-02 (×7): qty 1

## 2014-04-02 MED ORDER — SENNOSIDES-DOCUSATE SODIUM 8.6-50 MG PO TABS
2.0000 | ORAL_TABLET | ORAL | Status: DC
  Administered 2014-04-03 – 2014-04-04 (×2): 2 via ORAL
  Filled 2014-04-02 (×2): qty 2

## 2014-04-02 MED ORDER — OXYCODONE-ACETAMINOPHEN 5-325 MG PO TABS: 2.0000 | ORAL_TABLET | ORAL | Status: DC | PRN

## 2014-04-02 MED ORDER — OXYTOCIN 40 UNITS IN LACTATED RINGERS INFUSION - SIMPLE MED
62.5000 mL/h | INTRAVENOUS | Status: DC
  Filled 2014-04-02: qty 1000

## 2014-04-02 MED ORDER — LACTATED RINGERS IV BOLUS (SEPSIS): 1000.0000 mL | Freq: Once | INTRAVENOUS | Status: DC

## 2014-04-02 MED ORDER — FENTANYL CITRATE 0.05 MG/ML IJ SOLN
100.0000 ug | INTRAMUSCULAR | Status: DC | PRN
  Administered 2014-04-02: 100 ug via INTRAVENOUS
  Filled 2014-04-02: qty 2

## 2014-04-02 MED ORDER — OXYTOCIN BOLUS FROM INFUSION
500.0000 mL | INTRAVENOUS | Status: DC
  Administered 2014-04-02: 500 mL via INTRAVENOUS

## 2014-04-02 MED ORDER — DIPHENHYDRAMINE HCL 25 MG PO CAPS: 25.0000 mg | ORAL_CAPSULE | Freq: Four times a day (QID) | ORAL | Status: DC | PRN

## 2014-04-02 MED ORDER — SIMETHICONE 80 MG PO CHEW: 80.0000 mg | CHEWABLE_TABLET | ORAL | Status: DC | PRN

## 2014-04-02 MED ORDER — DIBUCAINE 1 % RE OINT: 1.0000 "application " | TOPICAL_OINTMENT | RECTAL | Status: DC | PRN

## 2014-04-02 MED ORDER — ONDANSETRON HCL 4 MG/2ML IJ SOLN: 4.0000 mg | INTRAMUSCULAR | Status: DC | PRN

## 2014-04-02 MED ORDER — PRENATAL MULTIVITAMIN CH
1.0000 | ORAL_TABLET | Freq: Every day | ORAL | Status: DC
Start: 2014-04-03 — End: 2014-04-04
  Administered 2014-04-03 – 2014-04-04 (×2): 1 via ORAL
  Filled 2014-04-02 (×2): qty 1

## 2014-04-02 MED ORDER — WITCH HAZEL-GLYCERIN EX PADS: 1.0000 "application " | MEDICATED_PAD | CUTANEOUS | Status: DC | PRN

## 2014-04-02 MED ORDER — ONDANSETRON HCL 4 MG PO TABS: 4.0000 mg | ORAL_TABLET | ORAL | Status: DC | PRN

## 2014-04-02 MED ORDER — OXYCODONE-ACETAMINOPHEN 5-325 MG PO TABS
2.0000 | ORAL_TABLET | ORAL | Status: DC | PRN
Start: 2014-04-02 — End: 2014-04-04

## 2014-04-02 NOTE — MAU Provider Note (Signed)
S: 34 y.o. @[redacted]w[redacted]d  presents to MAU for labor evaluation.  She reports good fetal movement, denies LOF, vaginal bleeding, vaginal itching/burning, urinary symptoms, h/a, dizziness, n/v, or fever/chills.    O: BP 131/74 mmHg  Pulse 114  Temp(Src) 98.5 F (36.9 C) (Oral)  Resp 18  LMP 07/10/2013   Dilation: 3.5 Effacement (%): 50, 40 Cervical Position: Middle Station: Ballotable Presentation: Vertex Exam by:: cheryl motte, rn   Cervix unchanged in 2 hours in MAU  A: Threatened labor at term  P: Following walk in MAU, FHR tracing with short variables with moderate variability.  EFM x additional 1.5 hours with LR x 1000 ml IV.  FHR with accels, moderate variability, no decels following IV fluids. Plan to D/C home but pt desires to stay to be reevaluated.  She reports increasing discomfort with contractions.  Rechecked cervix in 1 hour and 4cm with BBOW with worsening contractions.   [facility] Pt plans no analgesia during labor Expectant management Anticipate NSVD  Sharen CounterLisa Leftwich-Kirby Certified Nurse-Midwife

## 2014-04-02 NOTE — H&P (Signed)
Chief Complaint:  Labor Eval    HPI: Brittney Moore is a 34 y.o. [id number] at 6w0dwho presents to maternity admissions reporting contractions with progressive discomfort since 10AM today. Contractions have progressed through the day. No true discomfort until after walking the hallways in the MAU. Patient is unable to speak through contractions. Husband at bedside.  Denies fever, chills, dysuria. Endorses contractions, vaginal discharge (mucus and some blood). Good fetal movement.   History significant for 24 week twin demise with SVD, followed by 2 normal term SVDs with subsequent pregnancies.  She is GDM with current pregnancy and fetal size >90%tile at 34 weeks.    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 History of stomach ulcers   . Vitamin D deficiency   . Anemia   . Gestational diabetes mellitus, antepartum   . Gestational diabetes     Past obstetric history: OB History  Gravida Para Term Preterm AB SAB TAB Ectopic Multiple Living  6 3 2 1 2 2   1 2     # Outcome Date GA Lbr Len/2nd Weight Sex Delivery Anes PTL Lv  6 Current           5A Preterm 09/16/93 284w0d  Vag-Spont  N ND  5B Preterm 09/16/93 2463w0d Vag-Spont  N ND  4 SAB           3 SAB           2 Term    3.175 kg (7 lb) M Vag-Spont        Comments: No antenatal testin  1 Term    3.175 kg (7 lb) F Vag-Spont        Comments: No antenatal testing      Past  Surgical History: Past Surgical History  Procedure Laterality Date  . Cholecystectomy    . Tonsillectomy and adenoidectomy    . Wisdom tooth extraction    . Bartholin gland cyst excision       Family History: Family History  Problem Relation Age of Onset  . Congestive Heart Failure Mother   . Leukemia Father   . Heart disease Father   . Diabetes Father   . Cancer Maternal Grandfather   . Mental illness Paternal Grandmother   . Cancer Paternal Grandfather     Social History: History  Substance Use Topics  . Smoking status: Never Smoker   . Smokeless tobacco: Never Used  . Alcohol Use: No    Allergies:  Allergies  Allergen Reactions  . Food     paprika  . Keflex [Cephalexin] Rash    Meds:  Prescriptions prior to admission  Medication Sig Dispense Refill Last Dose  . ferrous sulfate 325 (65 FE) MG tablet Take 325 mg by mouth daily with breakfast.   04/01/2014 at Unknown time  .  Magnesium 250 MG TABS Take 250 mg by mouth daily.   04/01/2014 at Unknown time  . metFORMIN (GLUCOPHAGE) 500 MG tablet Take 1 pill at bedtime 31 tablet 12 04/01/2014 at Unknown time  . Prenatal Vit-Fe Fumarate-FA (PRENATAL MULTIVITAMIN) TABS tablet Take 1 tablet by mouth daily at 12 noon.   04/01/2014 at Unknown time  . pyridOXINE (VITAMIN B-6) 25 MG tablet Take 25 mg by mouth daily.   04/01/2014 at Unknown time  . ACCU-CHEK FASTCLIX LANCETS MISC 1 Units by Percutaneous route 4 (four) times daily. 100 each 12 Taking  . Blood Glucose Monitoring Suppl (ONE TOUCH BASIC SYSTEM) W/DEVICE KIT 1 kit by Does not apply route once. 1 each 0 Taking  . Elastic Bandages & Supports (MEDICAL COMPRESSION STOCKINGS) MISC 1 application by Does not apply route as needed. 1 each 1 Taking  . glucose blood test strip Use as instructed 100 each 12 Taking  . glucose blood test strip Use as instructed 100 each 12 Taking  . ONETOUCH DELICA LANCETS 10X MISC 1 Units by Does not apply route 4 (four) times daily. 100 each 6 Taking   . Prenat w/o A-FeCbGl-DSS-FA-DHA (CITRANATAL 90 DHA) 90-1 & 300 MG MISC Take 1 tablet by mouth daily. 30 each 12 Taking  . ranitidine (ZANTAC) 150 MG tablet Take 1 tablet (150 mg total) by mouth 2 (two) times daily. (Patient not taking: Reported on 04/02/2014) 60 tablet 1 Taking    ROS: Pertinent findings in history of present illness.  Physical Exam  Blood pressure 131/74, pulse 114, temperature 98.5 F (36.9 C), temperature source Oral, resp. rate 18, last menstrual period 07/10/2013. GENERAL: Well-developed, well-nourished female in no acute distress.  HEENT: normocephalic HEART: normal rate RESP: normal effort ABDOMEN: Soft, non-tender, gravid appropriate for gestational age EXTREMITIES: Nontender, no edema; pulses present and equal bilaterally NEURO: alert and oriented  Dilation: 4.5 Effacement (%): 100 Cervical Position: Anterior Station: -2 Presentation: Vertex Exam by:: cheryl motte, rn  FHT:  Baseline 145 , moderate variability, accelerations present, no decelerations Contractions: irreg   Labs: No results found for this or any previous visit (from the past 24 hour(s)).  Imaging:  US Ob Follow Up  03/05/2014   OBSTETRICAL ULTRASOUND: This exam was performed within a [facility]. The OB US report was generated in the AS system, and faxed to the ordering physician.   This report is available in the [facility]. See the AS Obstetric US report via the Image Link.  US Fetal Bpp W/o Non Stress  03/21/2014   OBSTETRICAL ULTRASOUND: This exam was performed within a [place] [facility]. The OB US report was generated in the AS system, and faxed to the ordering physician.   This report is available in the [facility]. See the [facility] Obstetric US report via the Image Link.   Assessment: 1. Obesity in pregnancy, antepartum, third trimester   2. Prior pregnancy with fetal demise, antepartum, first trimester   3. Labor: progressing with cervical  changes 4. Fetal Wellbeing: Category 1  5. Pain Control: fentanyl PRN 6. GBS: neg 7. 38.0 week IUP  Plan:  1. Admit to BS per consult with MD 2. Routine L&D orders 3. Analgesia/anesthesia PRN      Medication List    ASK your doctor about these medications        ACCU-CHEK FASTCLIX LANCETS Misc  1 Units by Percutaneous route 4 (four) times daily.     ONETOUCH DELICA LANCETS 32T Misc  1  Units by Does not apply route 4 (four) times daily.     CITRANATAL 90 DHA 90-1 & 300 MG Misc  Take 1 tablet by mouth daily.     ferrous sulfate 325 (65 FE) MG tablet  Take 325 mg by mouth daily with breakfast.     glucose blood test strip  Use as instructed     glucose blood test strip  Use as instructed     Magnesium 250 MG Tabs  Take 250 mg by mouth daily.     Medical Compression Stockings Misc  1 application by Does not apply route as needed.     metFORMIN 500 MG tablet  Commonly known as:  GLUCOPHAGE  Take 1 pill at bedtime     ONE TOUCH BASIC SYSTEM W/DEVICE Kit  1 kit by Does not apply route once.     prenatal multivitamin Tabs tablet  Take 1 tablet by mouth daily at 12 noon.     pyridOXINE 25 MG tablet  Commonly known as:  VITAMIN B-6  Take 25 mg by mouth daily.     ranitidine 150 MG tablet  Commonly known as:  ZANTAC  Take 1 tablet (150 mg total) by mouth 2 (two) times daily.        Elberta Leatherwood, MD 04/02/2014 4:15 PM   I have seen this patient and agree with the above resident's note.  Pt is midwife preferred so I assumed her care prior to delivery.  LEFTWICH-KIRBY, Steele Certified Nurse-Midwife

## 2014-04-02 NOTE — MAU Note (Signed)
Contractions started last night, now every 3 min.  Was 3+ when last checked.  Small amt of bleeding last night

## 2014-04-02 NOTE — Treatment Plan (Signed)
Called Loletta ParishAnita S., rn at home. She gave weight, measurement of NB, See delivery summary for info.

## 2014-04-03 LAB — ABO/RH: ABO/RH(D): O POS

## 2014-04-03 LAB — RPR: RPR Ser Ql: NONREACTIVE

## 2014-04-03 NOTE — Lactation Note (Signed)
This note was copied from the chart of Brittney Emelda Putt. Lactation Consultation Note Experienced BF BF her daughter for 4 months then got pregnant with her son. Had so much milk with him it soaked her clothes and bed etc.  BF him for 6 months and had to stop d/t wasn't producing enough milk and baby kept loosing weight and FTT. Mom stated both children had tight frenulums, and the son had to be clipped. This baby has recessed chin. Unable to assess frenulum at this time. Mom has small short shaft nipples. Unable to obtain a deep latch. Hand expression noted good colostrum. Encouraged to rub on red nipples. Mom denies soreness. Shells given to wear in bra to assist in everting nipples. Fitted mom w/#16NS, taught application and gave information sheet. Hand pump given to pre-pump nipples to pull them out. States baby has been sleepy, encouraged to try to wake baby to BF every three hours if baby hasn't cued. If baby not interested then pump to stimulate breast. Mom knows to pump q3h for 15-20 min. Educated about newborn behavior. Mom encouraged to do skin-to-skin. Mom reports + breast changes w/pregnancy. Referred to Baby and Me Book in Breastfeeding section Pg. 22-23 for position options and Proper latch demonstration.WH/LC brochure given w/resources, support groups and LC services. Patient Name: Brittney Montel ClockCharmaine Waibel ZOXWR'UToday's Date: 04/03/2014 Reason for consult: Initial assessment   Maternal Data Has patient been taught Hand Expression?: Yes Does the patient have breastfeeding experience prior to this delivery?: Yes  Feeding Feeding Type: Breast Fed Length of feed: 5 min  LATCH Score/Interventions Latch: Repeated attempts needed to sustain latch, nipple held in mouth throughout feeding, stimulation needed to elicit sucking reflex. Intervention(s): Breast compression;Breast massage;Assist with latch  Audible Swallowing: Spontaneous and intermittent  Type of Nipple: Everted at rest and  after stimulation  Comfort (Breast/Nipple): Soft / non-tender     Hold (Positioning): Assistance needed to correctly position infant at breast and maintain latch. Intervention(s): Skin to skin;Position options;Support Pillows  LATCH Score: 8  Lactation Tools Discussed/Used Tools: Pump;Nipple Dorris CarnesShields;Shells Nipple shield size: 16 Shell Type: Inverted Breast pump type: Manual Pump Review: Setup, frequency, and cleaning;Milk Storage Initiated by:: Peri JeffersonL. Brittney Laflamme RN Date initiated:: 04/03/14   Consult Status Consult Status: Follow-up Date: 04/03/14 Follow-up type: In-patient    Charyl DancerCARVER, Brittney Moore 04/03/2014, 6:21 AM

## 2014-04-03 NOTE — Progress Notes (Signed)
Post Partum Day 1 Subjective: no complaints, up ad lib, voiding, tolerating PO and + flatus   Brittney Moore is a 34 y.o. [id number]  who delivered a  baby girl at 5445w0d on 04/02/2014 at 17:53 via SVD. Labor was complicated by shoulder dystocia of 50 sec. VSS, has done well overnight. Ambulating well, tolerating PO liquids and solids, voiding and passing flatus. No BM yet. Pain controlled with ibuprofen.  Objective: Blood pressure 118/68, pulse 71, temperature 98.1 F (36.7 C), temperature source Oral, resp. rate 18, height 5\' 6"  (1.676 m), weight 116.574 kg (257 lb), last menstrual period 07/10/2013,  currently breastfeeding.  Physical Exam:  General: alert, cooperative, appears stated age and no distress Lochia: appropriate Uterine Fundus: firm Incision: n/a DVT Evaluation: No evidence of DVT seen on physical exam. No cords or calf tenderness. No significant calf/ankle edema.   Recent Labs  04/02/14 1725  HGB 12.3  HCT 37.2    Assessment/Plan: Plan for discharge tomorrow, Breastfeeding and Contraception : patient is still undecided.    LOS: 1 day   Anzlee Hinesley, Marlana Salvagenna M 04/03/2014, 8:52 AM

## 2014-04-04 ENCOUNTER — Other Ambulatory Visit: Payer: BLUE CROSS/BLUE SHIELD

## 2014-04-04 MED ORDER — IBUPROFEN 600 MG PO TABS: 600.0000 mg | ORAL_TABLET | Freq: Four times a day (QID) | ORAL | Status: DC

## 2014-04-04 NOTE — Lactation Note (Addendum)
This note was copied from the chart of Brittney Cadee Balaban. Lactation Consultation Note Mom stating baby is cluster feeding. Using #16 NS. Doesn't have bra so hasn't been able to wear shells to assist in everting flat nipples. Strong encouraged mom to wear shells to make a big difference. Stated she would. Gave mom a #20 NS in case nipples get larger. Explained what to look for in changing size. Mom is going to call for f/u appt. Next week after she gets settled. Encouraged to call for f/u to check feeding, sizing of NS and if weaning from NS is possible at that time. Mom stated she would. Answered questions parents had. Nipples cont. To be sore and red, comfort gels given. States NS helpful. Patient Name: Brittney Moore Reason for consult: Follow-up assessment;Breast/nipple pain   Maternal Data    Feeding Feeding Type: Breast Fed Length of feed: 60 min  LATCH Score/Interventions Latch: Grasps breast easily, tongue down, lips flanged, rhythmical sucking.  Audible Swallowing: A few with stimulation  Type of Nipple: Flat  Comfort (Breast/Nipple): Filling, red/small blisters or bruises, mild/mod discomfort  Problem noted: Mild/Moderate discomfort Interventions (Mild/moderate discomfort): Hand massage (nipple shield)  Hold (Positioning): Assistance needed to correctly position infant at breast and maintain latch. Intervention(s): Breastfeeding basics reviewed  LATCH Score: 6  Lactation Tools Discussed/Used Tools: Comfort gels   Consult Status Consult Status: Complete Date: 04/04/14 Follow-up type: Call as needed (is going to call for f/u appt.)    Lydia Meng, Diamond NickelLAURA G Moore, 8:57 AM

## 2014-04-04 NOTE — Discharge Summary (Signed)
Obstetric Discharge Summary Reason for Admission: onset of labor Prenatal Procedures: none Intrapartum Procedures: spontaneous vaginal delivery Postpartum Procedures: none Complications-Operative and Postpartum: none HEMOGLOBIN  Date Value Ref Range Status  04/02/2014 12.3 12.0 - 15.0 g/dL Final   HCT  Date Value Ref Range Status  04/02/2014 37.2 36.0 - 46.0 % Final  Delivery Note Delivered healthy baby girl on 04/02/2014 at 5:53 PM SVD. Presentation: vertex; Position: Left,, Occiput,, Anterior  First maneuver: McRoberts Second maneuver:Suprapubic pressure Total time: 50 seconds   APGAR: 9, 9;  Placenta status: Intact, Spontaneous.  Cord: 3 vessels with no complications  Anesthesia: None  Episiotomy: None Lacerations: None Suture Repair: n/a Est. Blood Loss (mL): 300  Today:  Patient doing well pp. No complications. VSS. Reporting nasal congestion today, which was relieved with sudafed. Pain well controlled. BM last night. Voiding and tolerating PO well. Minimal pain.  Patient is breastfeeding. Will use rhythm method for BC, and discussed limitations of the method.  Discussed f/u testing for diabetes, patient expressed understanding that weight loss and exercise are best preventative tools for diabetes.    Physical Exam:  General: alert, cooperative, appears stated age, no distress and moderately obese Lochia: appropriate Uterine Fundus: firm Incision: N/A  DVT Evaluation: No evidence of DVT seen on physical exam. No cords or calf tenderness. No significant calf/ankle edema.  Discharge Diagnoses: Term Pregnancy-delivered  Discharge Information: Date: 04/04/2014 Activity: unrestricted Diet: routine Medications: None Condition: stable Instructions: refer to practice specific booklet Discharge to: home   Newborn Data: Live born female  Birth Weight: 8 lb 0.8 oz (3651 g) APGAR: 9, 9  Home with mother.  Roman Sandall, Marlana Salvagenna M 04/04/2014, 7:56 AM

## 2014-04-04 NOTE — Discharge Instructions (Signed)
Breast Pumping Tips °If you are breastfeeding, there may be times when you cannot feed your baby directly. Returning to work or going on a trip are examples. Pumping allows you to store breast milk and feed it to your baby later.  °You may not get much milk when you first start to pump. Your breasts should start to make more after a few days. If you pump at the times you usually feed your baby, you may be able to keep making enough milk to feed your baby without also using formula. The more often you pump, the more milk your body will make. °WHEN SHOULD I PUMP?  °· You can start to pump soon after you have your baby. Ask your doctor what is right for you and your baby. °· If you are going back to work, start pumping a few weeks before. This gives you time to learn how to pump and to store a supply of milk. °· When you are with your baby, feed your baby when he or she is hungry. Pump after each feeding. °· When you are away from your baby for many hours, pump for about 15 minutes every 2-3 hours. Pump both breasts at the same time if you can. °· If your baby has a formula feeding, make sure to pump close to the same time. °· If you drink any alcohol, wait 2 hours before pumping. °HOW DO I GET READY TO PUMP? °Your let-down reflex is your body's natural reaction that makes your breast milk flow. It is easier to make your breast milk flow when you are relaxed. Try these things to help you relax: °· Smell one of your baby's blankets or an item of clothing. °· Look at a picture or video of your baby. °· Sit in a quiet, private space. °· Massage the breast you plan to pump. °· Place soothing warmth on the breast. °· Play relaxing music. °WHAT ARE SOME BREAST PUMPING TIPS? °· Wash your hands before you pump. You do not need to wash your nipples or breasts. °· There are three ways to pump. You can: °¨ Use your hand to massage and squeeze your breast. °¨ Use a handheld manual pump. °¨ Use an electric pump. °· Make sure the  suction cup on the breast pump is the right size. Place the suction cup directly over the nipple. It can be painful or hurt your nipple if it is the wrong size or placed wrong. °· Put a small amount of purified or modified lanolin on your nipple and areola if you are sore. °· If you are using an electric pump, change the speed and suction power to be more comfortable. °· You may need a different type of pump if pumping hurts or you do not get a lot of milk. Your doctor can help you pick what type of pump to use. °· Keep a full water bottle near you always. Drinking lots of fluid helps you make more milk. °· You can store your milk to use later. Pumped breast milk can be stored in a sealable, sterile container or plastic bag. Always put the date you pumped it on the container. °¨ Milk can stay out at room temperature for up to 8 hours. °¨ You can store your milk in the refrigerator for up to 8 days. °¨ You can store your milk in the freezer for 3 months. Thaw frozen milk using warm water. Do not put it in the microwave. °· Do not smoke.   Ask your doctor for help. WHEN SHOULD I CALL MY DOCTOR?  You have a hard time pumping.  You are worried you do not make enough milk.  You have nipple pain, soreness, or redness.  You want to take birth control pills. Document Released: 08/24/2007 Document Revised: 03/12/2013 Document Reviewed: 12/28/2012 [facility] Patient Information 2015 [place], [place]. This information is not intended to replace advice given to you by your health care provider. Make sure you discuss any questions you have with your health care provider. Postpartum Care After Vaginal Delivery After you deliver your newborn (postpartum period), the usual stay in the hospital is 24-72 hours. If there were problems with your labor or delivery, or if you have other medical problems, you might be in the hospital longer.  While you are in the hospital, you will receive help and instructions on how to care for  yourself and your newborn during the postpartum period.  While you are in the hospital:  Be sure to tell your nurses if you have pain or discomfort, as well as where you feel the pain and what makes the pain worse.  If you had an incision made near your vagina (episiotomy) or if you had some tearing during delivery, the nurses may put ice packs on your episiotomy or tear. The ice packs may help to reduce the pain and swelling.  If you are breastfeeding, you may feel uncomfortable contractions of your uterus for a couple of weeks. This is normal. The contractions help your uterus get back to normal size.  It is normal to have some bleeding after delivery.  For the first 1-3 days after delivery, the flow is red and the amount may be similar to a period.  It is common for the flow to start and stop.  In the first few days, you may pass some small clots. Let your nurses know if you begin to pass large clots or your flow increases.  Do not  flush blood clots down the toilet before having the nurse look at them.  During the next 3-10 days after delivery, your flow should become more watery and pink or brown-tinged in color.  Ten to fourteen days after delivery, your flow should be a small amount of yellowish-white discharge.  The amount of your flow will decrease over the first few weeks after delivery. Your flow may stop in 6-8 weeks. Most women have had their flow stop by 12 weeks after delivery.  You should change your sanitary pads frequently.  Wash your hands thoroughly with soap and water for at least 20 seconds after changing pads, using the toilet, or before holding or feeding your newborn.  You should feel like you need to empty your bladder within the first 6-8 hours after delivery.  In case you become weak, lightheaded, or faint, call your nurse before you get out of bed for the first time and before you take a shower for the first time.  Within the first few days after  delivery, your breasts may begin to feel tender and full. This is called engorgement. Breast tenderness usually goes away within 48-72 hours after engorgement occurs. You may also notice milk leaking from your breasts. If you are not breastfeeding, do not stimulate your breasts. Breast stimulation can make your breasts produce more milk.  Spending as much time as possible with your newborn is very important. During this time, you and your newborn can feel close and get to know each other. Having your newborn  stay in your room (rooming in) will help to strengthen the bond with your newborn. It will give you time to get to know your newborn and become comfortable caring for your newborn.  Your hormones change after delivery. Sometimes the hormone changes can temporarily cause you to feel sad or tearful. These feelings should not last more than a few days. If these feelings last longer than that, you should talk to your caregiver.  If desired, talk to your caregiver about methods of family planning or contraception.  Talk to your caregiver about immunizations. Your caregiver may want you to have the following immunizations before leaving the hospital:  Tetanus, diphtheria, and pertussis (Tdap) or tetanus and diphtheria (Td) immunization. It is very important that you and your family (including grandparents) or others caring for your newborn are up-to-date with the Tdap or Td immunizations. The Tdap or Td immunization can help protect your newborn from getting ill.  Rubella immunization.  Varicella (chickenpox) immunization.  Influenza immunization. You should receive this annual immunization if you did not receive the immunization during your pregnancy. Document Released: 01/02/2007 Document Revised: 11/30/2011 Document Reviewed: 11/02/2011 [facility] Patient Information 2015 [place], [place]. This information is not intended to replace advice given to you by your health care provider. Make sure you  discuss any questions you have with your health care provider.

## 2014-04-07 ENCOUNTER — Ambulatory Visit (HOSPITAL_COMMUNITY): Payer: BLUE CROSS/BLUE SHIELD

## 2014-04-08 ENCOUNTER — Encounter: Payer: BLUE CROSS/BLUE SHIELD | Admitting: Obstetrics & Gynecology

## 2014-05-07 ENCOUNTER — Encounter: Payer: Self-pay | Admitting: *Deleted

## 2014-05-07 ENCOUNTER — Encounter: Payer: Self-pay | Admitting: Obstetrics & Gynecology

## 2014-05-07 ENCOUNTER — Ambulatory Visit (INDEPENDENT_AMBULATORY_CARE_PROVIDER_SITE_OTHER): Payer: BLUE CROSS/BLUE SHIELD | Admitting: Obstetrics & Gynecology

## 2014-05-07 DIAGNOSIS — F53 Postpartum depression: Secondary | ICD-10-CM

## 2014-05-07 DIAGNOSIS — Z1151 Encounter for screening for human papillomavirus (HPV): Secondary | ICD-10-CM | POA: Diagnosis not present

## 2014-05-07 DIAGNOSIS — Z124 Encounter for screening for malignant neoplasm of cervix: Secondary | ICD-10-CM

## 2014-05-07 DIAGNOSIS — O99345 Other mental disorders complicating the puerperium: Secondary | ICD-10-CM

## 2014-05-07 MED ORDER — ESCITALOPRAM OXALATE 10 MG PO TABS: 10.0000 mg | ORAL_TABLET | Freq: Every day | ORAL | Status: DC

## 2014-05-07 NOTE — Progress Notes (Signed)
  Subjective:     Brittney Moore is a 34 y.o. female who presents for a postpartum visit. She is 6 weeks postpartum following a spontaneous vaginal delivery. I have fully reviewed the prenatal and intrapartum course. The delivery was at 38 gestational weeks. Outcome: spontaneous vaginal delivery. Anesthesia: epidural. Postpartum course has been okay. Baby's course has been fine. Baby is feeding by breast. Bleeding no bleeding. Bowel function is normal. Bladder function is normal. Patient is sexually active. Contraception method is none. Postpartum depression screening: positive.  The following portions of the patient's history were reviewed and updated as appropriate: allergies, current medications, past family history, past medical history, past social history, past surgical history and problem list.  Review of Systems Pertinent items are noted in HPI.   Objective:    BP 142/82 mmHg  Pulse 88  Temp(Src) 98.6 F (37 C) (Oral)  Resp 16  Ht 5\' 6"  (1.676 m)  Wt 234 lb (106.142 kg)  BMI 37.79 kg/m2  Breastfeeding? Yes  General:  teary   Breasts:  inspection negative, no nipple discharge or bleeding, no masses or nodularity palpable  Lungs: clear to auscultation bilaterally  Heart:  regular rate and rhythm, S1, S2 normal, no murmur, click, rub or gallop  Abdomen: soft, non-tender; bowel sounds normal; no masses,  no organomegaly   Vulva:  normal  Vagina: normal vagina  Cervix:  anteverted  Corpus: uterus absent  Adnexa:  no mass, fullness, tenderness  Rectal Exam: Not performed.        Assessment:     postpartum exam. Pap smear done at today's visit.   PP depression- She denies that she has this. She declines medications but does agree to see her regular counselor and she will let me refer her to a psychiatrist.  After more thought, she now is declining to see a psychiatrist. I will go ahead a call in lexapro so that if she changes her mind, then she can start taking it.  Plan:     1. Contraception: none 3. Follow up in: 1 month or as needed.

## 2014-05-07 NOTE — Addendum Note (Signed)
Addended by: Granville LewisLARK, Nardos Putnam L on: 05/07/2014 11:43 AM   Modules accepted: Orders

## 2014-05-09 LAB — CYTOLOGY - PAP

## 2014-05-13 ENCOUNTER — Ambulatory Visit (HOSPITAL_COMMUNITY): Payer: BLUE CROSS/BLUE SHIELD | Admitting: Physician Assistant

## 2014-06-03 ENCOUNTER — Ambulatory Visit (INDEPENDENT_AMBULATORY_CARE_PROVIDER_SITE_OTHER): Payer: BLUE CROSS/BLUE SHIELD | Admitting: Obstetrics & Gynecology

## 2014-06-03 ENCOUNTER — Other Ambulatory Visit: Payer: Self-pay | Admitting: Obstetrics & Gynecology

## 2014-06-03 ENCOUNTER — Encounter: Payer: Self-pay | Admitting: Obstetrics & Gynecology

## 2014-06-03 VITALS — BP 119/77 | HR 69 | Resp 16 | Ht 66.0 in | Wt 240.0 lb

## 2014-06-03 DIAGNOSIS — O925 Suppressed lactation: Secondary | ICD-10-CM

## 2014-06-03 DIAGNOSIS — O24430 Gestational diabetes mellitus in the puerperium, diet controlled: Secondary | ICD-10-CM

## 2014-06-03 DIAGNOSIS — Z8751 Personal history of pre-term labor: Secondary | ICD-10-CM | POA: Insufficient documentation

## 2014-06-03 DIAGNOSIS — E669 Obesity, unspecified: Secondary | ICD-10-CM | POA: Insufficient documentation

## 2014-06-03 DIAGNOSIS — F53 Postpartum depression: Secondary | ICD-10-CM | POA: Insufficient documentation

## 2014-06-03 DIAGNOSIS — O99345 Other mental disorders complicating the puerperium: Secondary | ICD-10-CM

## 2014-06-03 DIAGNOSIS — M898X8 Other specified disorders of bone, other site: Secondary | ICD-10-CM

## 2014-06-03 DIAGNOSIS — O24419 Gestational diabetes mellitus in pregnancy, unspecified control: Secondary | ICD-10-CM

## 2014-06-03 DIAGNOSIS — Z8759 Personal history of other complications of pregnancy, childbirth and the puerperium: Secondary | ICD-10-CM | POA: Insufficient documentation

## 2014-06-03 MED ORDER — METOCLOPRAMIDE HCL 10 MG PO TABS: 10.0000 mg | ORAL_TABLET | Freq: Four times a day (QID) | ORAL | Status: DC

## 2014-06-03 NOTE — Progress Notes (Signed)
Patient is a 34 year old female who is here with multiple complaints  First she needs to take a 2 hour GTT to look for type 2 diabetes. This is in process right now.  Secondly she complains of right foot pain on this side. I explained she will need to see a sports medicine or primary care doctor.  Thirdly she complains of continued pubic symphysis pain. Patient had a bimanual last visit with Dr. Marice Potterove, there was no uterine or adnexal tenderness or complaints. The pain seems to be localized to the pubic symphysis. The patient is already been to physical therapy during pregnancy which did not help. At this point I will refer her to a sports medicine doctor who can help her with both her foot and her pubic symphysis pain.  I will defer all imaging to him.  Fourth, the patient complains of decreased milk from the left breast. She has been pumping with minimal amounts coming from her left breast. Her right breast is producing milk but not enough to sustain breast-feeding only. Reglan prescribed today to help with milk production. Patient encouraged also to make appointment lactation consultant to further help her breast milk production.  Fifth, patient has questions about birth control. She is not a fan a progesterone only pills. She thinks she became pregnant while she was on the and the last time. She is used to using the rhythm method and tolerated she has amenorrhea due to breast-feeding and cannot use the rhythm method at this time. Patient is not a candidate for estrogen-containing pills consistent reduce her breast milk. Patient has had a Mirena IUD in the past approximately 15 years ago, did not like it. Patient was also offered Nexplanon but this does not seem like a good method to her. Patient is not a candidate for Depo-Provera due to the current depression she is experiencing. Patient will continue with condoms and possibly add spermicide.  Sixth, patient still complaining of depression. Patient  had refused counseling in the past and refuses again today. She was prescribed Lexapro but did not pick up the prescription.  The family is undergoing increased financial pressures which she thinks is adding to her stress anxiety and depression. Again suggested counseling which can help her with coping mechanisms.  Patient denies any suicidal ideation or homicidal ideation. Patient can come back any time to discuss her depression and we refer her onto appropriate providers if desired.

## 2014-06-03 NOTE — Patient Instructions (Signed)
Contraception Choices Contraception (birth control) is the use of any methods or devices to prevent pregnancy. Below are some methods to help avoid pregnancy. HORMONAL METHODS   Contraceptive implant. This is a thin, plastic tube containing progesterone hormone. It does not contain estrogen hormone. Your health care provider inserts the tube in the inner part of the upper arm. The tube can remain in place for up to 3 years. After 3 years, the implant must be removed. The implant prevents the ovaries from releasing an egg (ovulation), thickens the cervical mucus to prevent sperm from entering the uterus, and thins the lining of the inside of the uterus.  Progesterone-only injections. These injections are given every 3 months by your health care provider to prevent pregnancy. This synthetic progesterone hormone stops the ovaries from releasing eggs. It also thickens cervical mucus and changes the uterine lining. This makes it harder for sperm to survive in the uterus.  Birth control pills. These pills contain estrogen and progesterone hormone. They work by preventing the ovaries from releasing eggs (ovulation). They also cause the cervical mucus to thicken, preventing the sperm from entering the uterus. Birth control pills are prescribed by a health care provider.Birth control pills can also be used to treat heavy periods.  Minipill. This type of birth control pill contains only the progesterone hormone. They are taken every day of each month and must be prescribed by your health care provider.  Birth control patch. The patch contains hormones similar to those in birth control pills. It must be changed once a week and is prescribed by a health care provider.  Vaginal ring. The ring contains hormones similar to those in birth control pills. It is left in the vagina for 3 weeks, removed for 1 week, and then a new one is put back in place. The patient must be comfortable inserting and removing the ring  from the vagina.A health care provider's prescription is necessary.  Emergency contraception. Emergency contraceptives prevent pregnancy after unprotected sexual intercourse. This pill can be taken right after sex or up to 5 days after unprotected sex. It is most effective the sooner you take the pills after having sexual intercourse. Most emergency contraceptive pills are available without a prescription. Check with your pharmacist. Do not use emergency contraception as your only form of birth control. BARRIER METHODS   Female condom. This is a thin sheath (latex or rubber) that is worn over the penis during sexual intercourse. It can be used with spermicide to increase effectiveness.  Female condom. This is a soft, loose-fitting sheath that is put into the vagina before sexual intercourse.  Diaphragm. This is a soft, latex, dome-shaped barrier that must be fitted by a health care provider. It is inserted into the vagina, along with a spermicidal jelly. It is inserted before intercourse. The diaphragm should be left in the vagina for 6 to 8 hours after intercourse.  Cervical cap. This is a round, soft, latex or plastic cup that fits over the cervix and must be fitted by a health care provider. The cap can be left in place for up to 48 hours after intercourse.  Sponge. This is a soft, circular piece of polyurethane foam. The sponge has spermicide in it. It is inserted into the vagina after wetting it and before sexual intercourse.  Spermicides. These are chemicals that kill or block sperm from entering the cervix and uterus. They come in the form of creams, jellies, suppositories, foam, or tablets. They do not require a   prescription. They are inserted into the vagina with an applicator before having sexual intercourse. The process must be repeated every time you have sexual intercourse. INTRAUTERINE CONTRACEPTION  Intrauterine device (IUD). This is a T-shaped device that is put in a woman's uterus  during a menstrual period to prevent pregnancy. There are 2 types:  Copper IUD. This type of IUD is wrapped in copper wire and is placed inside the uterus. Copper makes the uterus and fallopian tubes produce a fluid that kills sperm. It can stay in place for 10 years.  Hormone IUD. This type of IUD contains the hormone progestin (synthetic progesterone). The hormone thickens the cervical mucus and prevents sperm from entering the uterus, and it also thins the uterine lining to prevent implantation of a fertilized egg. The hormone can weaken or kill the sperm that get into the uterus. It can stay in place for 3-5 years, depending on which type of IUD is used. PERMANENT METHODS OF CONTRACEPTION  Female tubal ligation. This is when the woman's fallopian tubes are surgically sealed, tied, or blocked to prevent the egg from traveling to the uterus.  Hysteroscopic sterilization. This involves placing a small coil or insert into each fallopian tube. Your doctor uses a technique called hysteroscopy to do the procedure. The device causes scar tissue to form. This results in permanent blockage of the fallopian tubes, so the sperm cannot fertilize the egg. It takes about 3 months after the procedure for the tubes to become blocked. You must use another form of birth control for these 3 months.  Female sterilization. This is when the female has the tubes that carry sperm tied off (vasectomy).This blocks sperm from entering the vagina during sexual intercourse. After the procedure, the man can still ejaculate fluid (semen). NATURAL PLANNING METHODS  Natural family planning. This is not having sexual intercourse or using a barrier method (condom, diaphragm, cervical cap) on days the woman could become pregnant.  Calendar method. This is keeping track of the length of each menstrual cycle and identifying when you are fertile.  Ovulation method. This is avoiding sexual intercourse during ovulation.  Symptothermal  method. This is avoiding sexual intercourse during ovulation, using a thermometer and ovulation symptoms.  Post-ovulation method. This is timing sexual intercourse after you have ovulated. Regardless of which type or method of contraception you choose, it is important that you use condoms to protect against the transmission of sexually transmitted infections (STIs). Talk with your health care provider about which form of contraception is most appropriate for you. Document Released: 03/07/2005 Document Revised: 03/12/2013 Document Reviewed: 08/30/2012 [employer] Patient Information 2015 [employer]. This information is not intended to replace advice given to you by your health care provider. Make sure you discuss any questions you have with your health care provider.  

## 2014-06-04 LAB — GLUCOSE TOLERANCE, 2 HOURS
Glucose, 2 hour: 101 mg/dL (ref 70–139)
Glucose, Fasting: 85 mg/dL (ref 70–99)

## 2014-06-04 LAB — GLUCOSE TOLERANCE, 2 HOURS W/ 1HR
GLUCOSE, 2 HOUR: 101 mg/dL (ref 70–139)
Glucose, 1 hour: 146 mg/dL (ref 70–170)
Glucose, Fasting: 85 mg/dL (ref 70–99)

## 2014-06-05 ENCOUNTER — Telehealth: Payer: Self-pay | Admitting: *Deleted

## 2014-06-05 ENCOUNTER — Encounter: Payer: Self-pay | Admitting: Obstetrics & Gynecology

## 2014-06-05 DIAGNOSIS — O24419 Gestational diabetes mellitus in pregnancy, unspecified control: Secondary | ICD-10-CM | POA: Insufficient documentation

## 2014-06-05 NOTE — Telephone Encounter (Signed)
Pt notified of normal 2 hr GTT. 

## 2014-06-24 ENCOUNTER — Other Ambulatory Visit: Payer: Self-pay | Admitting: Obstetrics & Gynecology

## 2014-07-09 ENCOUNTER — Emergency Department
Admission: EM | Admit: 2014-07-09 | Discharge: 2014-07-09 | Disposition: A | Discharge status: Home / Self Care | Payer: BLUE CROSS/BLUE SHIELD | Source: Home / Self Care | Attending: Family Medicine | Admitting: Family Medicine

## 2014-07-09 ENCOUNTER — Encounter: Payer: Self-pay | Admitting: *Deleted

## 2014-07-09 DIAGNOSIS — M545 Low back pain, unspecified: Secondary | ICD-10-CM

## 2014-07-09 LAB — POCT CBC W AUTO DIFF (K'VILLE URGENT CARE)

## 2014-07-09 LAB — POCT URINALYSIS DIP (MANUAL ENTRY)
BILIRUBIN UA: NEGATIVE
BILIRUBIN UA: NEGATIVE
GLUCOSE UA: NEGATIVE
Leukocytes, UA: NEGATIVE
NITRITE UA: NEGATIVE
Protein Ur, POC: NEGATIVE
Spec Grav, UA: 1.025 (ref 1.005–1.03)
UROBILINOGEN UA: 0.2 (ref 0–1)
pH, UA: 7 (ref 5–8)

## 2014-07-09 LAB — POCT URINE PREGNANCY: Preg Test, Ur: NEGATIVE

## 2014-07-09 NOTE — ED Provider Notes (Signed)
CSN: [id number]     Arrival date & [id number] 07/09/14  1118 History   First MD Initiated Contact with Patient 07/09/14 1227     Chief Complaint  Patient presents with  . Back Pain      HPI Comments: Patient recalls developing a sharp fleeting pain in her left lower back four days ago.  Two days ago she developed constant left flank pain that "catches" with movement such as twisting and standing from a sitting position.  She recalls no trauma.   She denies bowel or bladder dysfunction, and no saddle numbness.  She recalls minimal vaginal spotting two days ago but denies vaginal discharge or pelvic pain.  She has a 583 month old child and carries him on her left side.    Patient is a 34 y.o. female presenting with back pain. The history is provided by the patient.  Back Pain Location:  Lumbar spine Quality:  Aching and shooting Radiates to:  Does not radiate Pain severity:  Moderate Pain is:  Same all the time Onset quality:  Gradual Duration:  4 days Timing:  Constant Progression:  Unchanged Chronicity:  New Context: lifting heavy objects   Context: not MVA and not recent injury   Worsened by:  Ambulation and movement Associated symptoms: no abdominal pain, no bladder incontinence, no bowel incontinence, no chest pain, no fever, no leg pain, no numbness, no paresthesias, no pelvic pain, no perianal numbness, no tingling and no weakness   Risk factors: lack of exercise and obesity     Past Medical History  Diagnosis Date  . History of stomach ulcers   . Vitamin D deficiency   . Anemia   . Gestational diabetes mellitus, antepartum   . Gestational diabetes    Past Surgical History  Procedure Laterality Date  . Cholecystectomy    . Tonsillectomy and adenoidectomy    . Wisdom tooth extraction    . Bartholin gland cyst excision     Family History  Problem Relation Age of Onset  . Congestive Heart Failure Mother   . Leukemia Father   . Heart disease Father   . Diabetes Father   .  Cancer Maternal Grandfather   . Mental illness Paternal Grandmother   . Cancer Paternal Grandfather    History  Substance Use Topics  . Smoking status: Never Smoker   . Smokeless tobacco: Never Used  . Alcohol Use: No   OB History    Gravida Para Term Preterm AB TAB SAB Ectopic Multiple Living   6 4 3 1 2  2  1 3      Review of Systems  Constitutional: Negative for fever.  Cardiovascular: Negative for chest pain.  Gastrointestinal: Negative for abdominal pain and bowel incontinence.  Genitourinary: Negative for bladder incontinence and pelvic pain.  Musculoskeletal: Positive for back pain.  Neurological: Negative for tingling, weakness, numbness and paresthesias.  All other systems reviewed and are negative.   Allergies  Other and Keflex  Home Medications   Prior to Admission medications   Medication Sig Start Date End Date Taking? Authorizing Provider  ACCU-CHEK FASTCLIX LANCETS MISC  03/16/14   Historical Provider, MD  BAYER CONTOUR TEST test strip  03/30/14   Historical Provider, MD  escitalopram (LEXAPRO) 10 MG tablet Take 1 tablet (10 mg total) by mouth daily. Patient not taking: Reported on 06/03/2014 05/07/14   Allie BossierMyra C Dove, MD  ESSENTIAL MAGNESIUM 250 MG TABS Take 1 tablet by mouth daily. 03/15/14   Historical Provider, MD  fluticasone (FLONASE) 50 MCG/ACT nasal spray  04/29/14   Historical Provider, MD  ibuprofen (ADVIL,MOTRIN) 600 MG tablet Take 1 tablet (600 mg total) by mouth every 6 (six) hours. Patient not taking: Reported on 06/03/2014 04/04/14   Jacklyn Shell, CNM  metFORMIN (GLUCOPHAGE) 500 MG tablet Take 500 mg by mouth at bedtime. 03/04/14   Historical Provider, MD  metoCLOPramide (REGLAN) 10 MG tablet TAKE 1 TABLET (10 MG TOTAL) BY MOUTH 4 (FOUR) TIMES DAILY. 06/24/14   Lesly Dukes, MD  Prenat w/o A-FeCbGl-DSS-FA-DHA (CITRANATAL 90 DHA) 90-1 & 300 MG MISC Take 1 tablet by mouth daily. 09/16/13   Tereso Newcomer, MD  pseudoephedrine (SUDAFED) 30 MG  tablet Take 30 mg by mouth every 4 (four) hours as needed for congestion.    Historical Provider, MD  Vitamin D, Ergocalciferol, (DRISDOL) 50000 UNITS CAPS capsule  03/31/14   Historical Provider, MD   BP 144/78 mmHg  Pulse 73  Temp(Src) 98.2 F (36.8 C) (Oral)  Resp 18  Ht  (1.676 m)  Wt 247 lb (112.038 kg)  BMI 39.89 kg/m2  SpO2 96%  LMP 07/06/2014 Physical Exam  Constitutional: She is oriented to person, place, and time. She appears well-developed and well-nourished. No distress.  Patient is obese (BMI   39.9)  HENT:  Head: Normocephalic.  Eyes: Conjunctivae are normal. Pupils are equal, round, and reactive to light.  Neck: Neck supple.  Cardiovascular: Normal heart sounds.   Pulmonary/Chest: Breath sounds normal.  Abdominal: Bowel sounds are normal. There is no tenderness.  Musculoskeletal: She exhibits tenderness. She exhibits no edema.       Lumbar back: She exhibits decreased range of motion. She exhibits no bony tenderness.       Back:  Back:  Decreased range of motion but can heel/toe walk and squat without difficulty.  Tenderness in the  left paraspinous muscles from L2 to L4.  Straight leg raising test is negative.  Sitting knee extension test is negative.  Strength and sensation in the lower extremities is normal.  Patellar and achilles reflexes are normal      Neurological: She is alert and oriented to person, place, and time.  Skin: Skin is warm and dry. No rash noted.    ED Course  Procedures  none  Labs Reviewed  POCT URINALYSIS DIP (MANUAL ENTRY):  BLO trace intact; otherwise negative  POCT URINE PREGNANCY negative  POCT CBC W AUTO DIFF ([facility]):  WBC 10.5; LY 26.7; MO 4.2; GR 69.1; Hgb 13.3; Platelets 265          MDM   1. Left-sided low back pain without sciatica     Apply ice pack for 20 to 30 minutes, 3 to 4 times daily  Continue until pain decreases.  May take Ibuprofen , 4 tabs every 8 hours with food.  Obtain well  fitting shoe inserts for both shoes.  Carry child on right side as much as possible. Followup with Dr. Rodney [facility]) if not improving about two weeks.     Lattie Haw, MD 07/17/14 [id number]

## 2014-07-09 NOTE — ED Notes (Signed)
Pt c/o LT flank pain and LBP x 4 days. She reports having her first period since having her baby 3 months ago beginning 3 days ago. She is currently breastfeeding. She has taken tylenol with no relief.

## 2014-07-09 NOTE — Discharge Instructions (Signed)
Apply ice pack for 20 to 30 minutes, 3 to 4 times daily  Continue until pain decreases.  May take Ibuprofen 200mg , 4 tabs every 8 hours with food.  Obtain well fitting shoe inserts for both shoes.  Carry child on right side as much as possible.

## 2014-09-04 ENCOUNTER — Ambulatory Visit (INDEPENDENT_AMBULATORY_CARE_PROVIDER_SITE_OTHER): Payer: BLUE CROSS/BLUE SHIELD | Admitting: Obstetrics & Gynecology

## 2014-09-04 ENCOUNTER — Encounter: Payer: Self-pay | Admitting: Obstetrics & Gynecology

## 2014-09-04 VITALS — BP 120/83 | HR 79 | Resp 16 | Ht 66.0 in | Wt 258.0 lb

## 2014-09-04 DIAGNOSIS — N644 Mastodynia: Secondary | ICD-10-CM

## 2014-09-04 MED ORDER — CEPHALEXIN 500 MG PO CAPS: 500.0000 mg | ORAL_CAPSULE | Freq: Four times a day (QID) | ORAL | Status: DC

## 2014-09-04 NOTE — Progress Notes (Signed)
   Subjective:    Patient ID: Brittney Moore, female    DOB: 09-Jan-1981, 34 y.o.   MRN: [id number]  HPI  34 yo lady here with a week h/o right breast pain. She also noted a red spot on the lower portion of the same breast. She is breast feeding.  Review of Systems     Objective:   Physical Exam [id number] Breathing and ambulating normally 1 cm red area on lower outer portion of right beast No masses palpable       Assessment & Plan:  Possible spider bite Reassurance given Keflex for 7 days. If no better, rec that she go to her primary care doctor for second opinion

## 2014-11-04 LAB — OB RESULTS CONSOLE HEPATITIS B SURFACE ANTIGEN: HEP B S AG: NEGATIVE

## 2014-11-04 LAB — OB RESULTS CONSOLE HIV ANTIBODY (ROUTINE TESTING): HIV: NONREACTIVE

## 2014-11-04 LAB — OB RESULTS CONSOLE RPR: RPR: NONREACTIVE

## 2014-11-04 LAB — OB RESULTS CONSOLE ANTIBODY SCREEN: Antibody Screen: NEGATIVE

## 2014-11-04 LAB — OB RESULTS CONSOLE HGB/HCT, BLOOD
HCT: 38 %
Hemoglobin: 12.8 g/dL

## 2014-11-04 LAB — OB RESULTS CONSOLE PLATELET COUNT: Platelets: 260 10*3/uL

## 2014-11-04 LAB — OB RESULTS CONSOLE RUBELLA ANTIBODY, IGM: RUBELLA: IMMUNE

## 2014-11-04 LAB — OB RESULTS CONSOLE ABO/RH: RH TYPE: POSITIVE

## 2014-11-04 LAB — OB RESULTS CONSOLE GC/CHLAMYDIA
Chlamydia: NEGATIVE
Gonorrhea: NEGATIVE

## 2015-02-26 ENCOUNTER — Encounter: Payer: Self-pay | Admitting: Obstetrics & Gynecology

## 2015-02-26 ENCOUNTER — Encounter: Payer: Self-pay | Admitting: Family Medicine

## 2015-02-26 ENCOUNTER — Ambulatory Visit (INDEPENDENT_AMBULATORY_CARE_PROVIDER_SITE_OTHER): Payer: BLUE CROSS/BLUE SHIELD | Admitting: Family Medicine

## 2015-02-26 DIAGNOSIS — O09522 Supervision of elderly multigravida, second trimester: Secondary | ICD-10-CM

## 2015-02-26 DIAGNOSIS — O24415 Gestational diabetes mellitus in pregnancy, controlled by oral hypoglycemic drugs: Secondary | ICD-10-CM

## 2015-02-26 DIAGNOSIS — O24919 Unspecified diabetes mellitus in pregnancy, unspecified trimester: Secondary | ICD-10-CM | POA: Insufficient documentation

## 2015-02-26 DIAGNOSIS — O24912 Unspecified diabetes mellitus in pregnancy, second trimester: Secondary | ICD-10-CM

## 2015-02-26 DIAGNOSIS — O0992 Supervision of high risk pregnancy, unspecified, second trimester: Secondary | ICD-10-CM

## 2015-02-26 DIAGNOSIS — O0990 Supervision of high risk pregnancy, unspecified, unspecified trimester: Secondary | ICD-10-CM | POA: Insufficient documentation

## 2015-02-26 DIAGNOSIS — O09529 Supervision of elderly multigravida, unspecified trimester: Secondary | ICD-10-CM | POA: Insufficient documentation

## 2015-02-26 MED ORDER — GLYBURIDE 2.5 MG PO TABS: 1.2500 mg | ORAL_TABLET | Freq: Every day | ORAL | Status: DC

## 2015-02-26 NOTE — Progress Notes (Signed)
Patient ID: Brittney Moore, female   DOB: 02/06/1981, 34 y.o.   MRN: [id number] Pt here for OB consult. [facility]. Pt states she has been diagnosed with GDM and providers are recommending her to start taking insulin which will prevent her from seeing a midwife. She states she would need to see an [place].

## 2015-02-26 NOTE — Patient Instructions (Signed)
Following an appropriate diet and keeping your blood sugar under control is the most important thing to do for your health and that of your unborn baby.  Please check your blood sugar 4 times daily.  Please keep accurate BS logs and bring them with you to every visit.  Please bring your meter also.  Goals for Blood sugar should be: 1. Fasting (first thing in the morning before eating) should be less than 90.   2.  2 hours after meals should be less than 120.  Please eat 3 meals and 3 snacks.  Include protein (meat, dairy-cheese, eggs, nuts) with all meals.  Be mindful that carbohydrates increase your blood sugar.  Not just sweet food (cookies, cake, donuts, fruit, juice, soda) but also bread, pasta, rice, and potatoes.  You have to limit how many carbs you are eating.  Adding exercise, as little as 30 minutes a day can decrease your blood sugar.  Gestational Diabetes Mellitus Gestational diabetes mellitus, often simply referred to as gestational diabetes, is a type of diabetes that some women develop during pregnancy. In gestational diabetes, the pancreas does not make enough insulin (a hormone), the cells are less responsive to the insulin that is made (insulin resistance), or both.Normally, insulin moves sugars from food into the tissue cells. The tissue cells use the sugars for energy. The lack of insulin or the lack of normal response to insulin causes excess sugars to build up in the blood instead of going into the tissue cells. As a result, high blood sugar (hyperglycemia) develops. The effect of high sugar (glucose) levels can cause many problems.  RISK FACTORS You have an increased chance of developing gestational diabetes if you have a family history of diabetes and also have one or more of the following risk factors:  A body mass index over 30 (obesity).  A previous pregnancy with gestational diabetes.  An older age at the time of pregnancy. If blood glucose levels are kept in  the normal range during pregnancy, women can have a healthy pregnancy. If your blood glucose levels are not well controlled, there may be risks to you, your unborn baby (fetus), your labor and delivery, or your newborn baby.  SYMPTOMS  If symptoms are experienced, they are much like symptoms you would normally expect during pregnancy. The symptoms of gestational diabetes include:   Increased thirst (polydipsia).  Increased urination (polyuria).  Increased urination during the night (nocturia).  Weight loss. This weight loss may be rapid.  Frequent, recurring infections.  Tiredness (fatigue).  Weakness.  Vision changes, such as blurred vision.  Fruity smell to your breath.  Abdominal pain. DIAGNOSIS Diabetes is diagnosed when blood glucose levels are increased. Your blood glucose level may be checked by one or more of the following blood tests:  A fasting blood glucose test. You will not be allowed to eat for at least 8 hours before a blood sample is taken.  A random blood glucose test. Your blood glucose is checked at any time of the day regardless of when you ate.  An oral glucose tolerance test (OGTT). Your blood glucose is measured after you have not eaten (fasted) for 1-3 hours and then after you drink a glucose-containing beverage. Since the hormones that cause insulin resistance are highest at about 24-28 weeks of a pregnancy, an OGTT is usually performed during that time. If you have risk factors, you may be screened for undiagnosed type 2 diabetes at your first prenatal visit. TREATMENT  Gestational diabetes   should be managed first with diet and exercise. Medicines may be added only if they are needed.  You will need to take diabetes medicine or insulin daily to keep blood glucose levels in the desired range.  You will need to match insulin dosing with exercise and healthy food choices. If you have gestational diabetes, your treatment goal is to maintain the following  blood glucose levels:  Before meals (preprandial): at or below 95 mg/dL.  After meals (postprandial):  One hour after a meal: at or below 140 mg/dL.  Two hours after a meal: at or below 120 mg/dL. If you have pre-existing type 1 or type 2 diabetes, your treatment goal is to maintain the following blood glucose levels:  Before meals, at bedtime, and overnight: 60-99 mg/dL.  After meals: peak of 100-129 mg/dL. HOME CARE INSTRUCTIONS   Have your hemoglobin A1c level checked twice a year.  Perform daily blood glucose monitoring as directed by your health care provider. It is common to perform frequent blood glucose monitoring.  Monitor urine ketones when you are ill and as directed by your health care provider.  Take your diabetes medicine and insulin as directed by your health care provider to maintain your blood glucose level in the desired range.  Never run out of diabetes medicine or insulin. It is needed every day.  Adjust insulin based on your intake of carbohydrates. Carbohydrates can raise blood glucose levels but need to be included in your diet. Carbohydrates provide vitamins, minerals, and fiber which are an essential part of a healthy diet. Carbohydrates are found in fruits, vegetables, whole grains, dairy products, legumes, and foods containing added sugars.  Eat healthy foods. Alternate 3 meals with 3 snacks.  Maintain a healthy weight gain. The usual total expected weight gain varies according to your prepregnancy body mass index (BMI).  Carry a medical alert card or wear your medical alert jewelry.  Carry a 15-gram carbohydrate snack with you at all times to treat low blood glucose (hypoglycemia). Some examples of 15-gram carbohydrate snacks include:  Glucose tablets, 3 or 4.  Glucose gel, 15-gram tube.  Raisins, 2 tablespoons (24 g).  Jelly beans, 6.  Animal crackers, 8.  Fruit juice, regular soda, or low-fat milk, 4 ounces (120 mL).  Gummy treats,  9.  Recognize hypoglycemia. Hypoglycemia during pregnancy occurs with blood glucose levels of 60 mg/dL and below. The risk for hypoglycemia increases when fasting or skipping meals, during or after intense exercise, and during sleep. Hypoglycemia symptoms can include:  Tremors or shakes.  Decreased ability to concentrate.  Sweating.  Increased heart rate.  Headache.  Dry mouth.  Hunger.  Irritability.  Anxiety.  Restless sleep.  Altered speech or coordination.  Confusion.  Treat hypoglycemia promptly. If you are alert and able to safely swallow, follow the 15:15 rule:  Take 15-20 grams of rapid-acting glucose or carbohydrate. Rapid-acting options include glucose gel, glucose tablets, or 4 ounces (120 mL) of fruit juice, regular soda, or low-fat milk.  Check your blood glucose level 15 minutes after taking the glucose.  Take 15-20 grams more of glucose if the repeat blood glucose level is still 70 mg/dL or below.  Eat a meal or snack within 1 hour once blood glucose levels return to normal.  Be alert to polyuria (excess urination) and polydipsia (excess thirst) which are early signs of hyperglycemia. An early awareness of hyperglycemia allows for prompt treatment. Treat hyperglycemia as directed by your health care provider.  Engage in at least   30 minutes of physical activity a day or as directed by your health care provider. Ten minutes of physical activity timed 30 minutes after each meal is encouraged to control postprandial blood glucose levels.  Adjust your insulin dosing and food intake as needed if you start a new exercise or sport.  Follow your sick-day plan at any time you are unable to eat or drink as usual.  Avoid tobacco and alcohol use.  Keep all follow-up visits as directed by your health care provider.  Follow the advice of your health care provider regarding your prenatal and post-delivery (postpartum) appointments, meal planning, exercise, medicines,  vitamins, blood tests, other medical tests, and physical activities.  Perform daily skin and foot care. Examine your skin and feet daily for cuts, bruises, redness, nail problems, bleeding, blisters, or sores.  Brush your teeth and gums at least twice a day and floss at least once a day. Follow up with your dentist regularly.  Schedule an eye exam during the first trimester of your pregnancy or as directed by your health care provider.  Share your diabetes management plan with your workplace or school.  Stay up-to-date with immunizations.  Learn to manage stress.  Obtain ongoing diabetes education and support as needed.  Learn about and consider breastfeeding your baby.  You should have your blood sugar level checked 6-12 weeks after delivery. This is done with an oral glucose tolerance test (OGTT). SEEK MEDICAL CARE IF:   You are unable to eat food or drink fluids for more than 6 hours.  You have nausea and vomiting for more than 6 hours.  You have a blood glucose level of 200 mg/dL and you have ketones in your urine.  There is a change in mental status.  You develop vision problems.  You have a persistent headache.  You have upper abdominal pain or discomfort.  You develop an additional serious illness.  You have diarrhea for more than 6 hours.  You have been sick or have had a fever for a couple of days and are not getting better. SEEK IMMEDIATE MEDICAL CARE IF:   You have difficulty breathing.  You no longer feel the baby moving.  You are bleeding or have discharge from your vagina.  You start having premature contractions or labor. MAKE SURE YOU:  Understand these instructions.  Will watch your condition.  Will get help right away if you are not doing well or get worse.   This information is not intended to replace advice given to you by your health care provider. Make sure you discuss any questions you have with your health care provider.   Document  Released: 06/13/2000 Document Revised: 03/28/2014 Document Reviewed: 10/04/2011 Elsevier Interactive Patient Education [employer].

## 2015-02-27 ENCOUNTER — Encounter: Payer: Self-pay | Admitting: *Deleted

## 2015-02-27 LAB — HEMOGLOBIN A1C: A1c: 5.9

## 2015-02-27 NOTE — Assessment & Plan Note (Signed)
Decrease Metformin to 1000 mg hs and add glyburide 1.25 mg q hs

## 2015-02-27 NOTE — Progress Notes (Signed)
    Subjective:    Patient ID: Brittney Moore is a 34 y.o. female presenting with OB Consultation  on 02/26/2015  HPI: Here today to discuss transferring care back here.  States she was embarassed due to getting pregnant too quick.  She was seeing midwifery practice in [place]--but her BS has been out of wack and they think she needs insulin.  She is resistant to starting insulin.  She is currently on glucophage 2000 mg twice daily--1000mg  at dinner and 1000mg  at hs. She is having diarrhea at this dose.  Review of Systems  Constitutional: Negative for fever and chills.  Respiratory: Negative for shortness of breath.   Cardiovascular: Negative for chest pain.  Gastrointestinal: Negative for nausea, vomiting and abdominal pain.  Genitourinary: Negative for dysuria.  Skin: Negative for rash.      Objective:    There were no vitals taken for this visit. Physical Exam  Constitutional: She is oriented to person, place, and time. She appears well-developed and well-nourished. No distress.  HENT:  Head: Normocephalic and atraumatic.  Eyes: No scleral icterus.  Neck: Neck supple.  Cardiovascular: Normal rate.   Pulmonary/Chest: Effort normal.  Abdominal: Soft.  Neurological: She is alert and oriented to person, place, and time.  Skin: Skin is warm and dry.  Psychiatric: She has a normal mood and affect.   BS fastings are in the low 100's. Postprandial BS are WNL.     Assessment & Plan:   Problem List Items Addressed This Visit      High   Supervision of high risk pregnancy, antepartum - Primary   AMA (advanced maternal age) multigravida 35+     Medium   Diabetes mellitus in pregnancy, antepartum    Decrease Metformin to 1000 mg hs and add glyburide 1.25 mg q hs      Relevant Medications   metFORMIN (GLUCOPHAGE-XR) 500 MG 24 hr tablet   glyBURIDE (DIABETA) 2.5 MG tablet      Total face-to-face time with patient: 25 minutes. Over 50% of encounter was spent on counseling  and coordination of care. Return in about 2 weeks (around 03/12/2015) for ob visit.  Kortlynn Poust S 02/27/2015 11:09 AM

## 2015-03-13 ENCOUNTER — Ambulatory Visit (INDEPENDENT_AMBULATORY_CARE_PROVIDER_SITE_OTHER): Payer: BLUE CROSS/BLUE SHIELD | Admitting: Family

## 2015-03-13 ENCOUNTER — Encounter: Payer: Self-pay | Admitting: Obstetrics & Gynecology

## 2015-03-13 ENCOUNTER — Encounter: Payer: Self-pay | Admitting: Family

## 2015-03-13 VITALS — BP 131/74 | HR 84 | Wt 248.0 lb

## 2015-03-13 DIAGNOSIS — O24912 Unspecified diabetes mellitus in pregnancy, second trimester: Secondary | ICD-10-CM

## 2015-03-13 DIAGNOSIS — O0992 Supervision of high risk pregnancy, unspecified, second trimester: Secondary | ICD-10-CM

## 2015-03-13 MED ORDER — GLUCOSE BLOOD VI STRP: 1.0000 | ORAL_STRIP | Freq: Four times a day (QID) | Status: DC

## 2015-03-13 MED ORDER — GLYBURIDE 2.5 MG PO TABS: 2.5000 mg | ORAL_TABLET | Freq: Every day | ORAL | Status: DC

## 2015-03-13 NOTE — Progress Notes (Signed)
.   Subjective:  Montel ClockCharmaine Gilman is a 34 y.o. [id number] at 6279w6d being seen today for intial prenatal care.  Transferring care from [facility] due to diagnosed GDM.  Prior pregnancy with our practice.  She is currently monitored for the following issues for this high-risk pregnancy and has prior pregnancy with 24 week twin fetal demise, antepartum; Sleep apnea; History of shoulder dystocia in prior pregnancy; Obesity; Post partum depression; history of preterm delivery;diabetes mellitus in pregnancy, antepartum; and AMA (advanced maternal age) multigravida 35+ on her problem list.  Patient reports beginning to feel pubic symphsis pain.  Tried physical therapy and pregnancy belts with little relief last pregnancy.  Open to another physical therapist.   Contractions: Irregular. Vag. Bleeding: None.  Movement: Present. Denies leaking of fluid.   The following portions of the patient's history were reviewed and updated as appropriate: allergies, current medications, past family history, past medical history, past social history, past surgical history and problem list. Problem list updated.  Objective:   Filed Vitals:   03/13/15 0836  BP: 131/74  Pulse: 84  Weight: 248 lb (112.492 kg)    Fetal Status: Fetal Heart Rate (bpm): 146 Fundal Height: 30 cm Movement: Present     General:  Alert, oriented and cooperative. Patient is in no acute distress.  Skin: Skin is warm and dry. No rash noted.   Cardiovascular: Normal heart rate noted  Respiratory: Normal respiratory effort, no problems with respiration noted  Abdomen: Soft, gravid, appropriate for gestational age. Pain/Pressure: Absent     Pelvic: Vag. Bleeding: None Vag D/C Character: Thin   Cervical exam deferred        Extremities: Normal range of motion.  Edema: Trace  Mental Status: Normal mood and affect. Normal behavior. Normal judgment and thought content.   Urinalysis: Urine Protein: Negative Urine Glucose: Negative  Fasting PPB  PPL PPD  88-111, 9/14 abnl 103-128, 4/14 abnl 81-120, 0/14 abnl 94-143, 4/12 abnl    Assessment and Plan:  Pregnancy: [id number] at 7979w6d  1. Supervision of high risk pregnancy, antepartum, second trimester - US MFM OB COMP + 14 WK; Future  2. Diabetes mellitus in pregnancy, antepartum, second trimester - Growth Ultrasound - Increase glyburide to 2.5 mg HS; consulted with Dr. Adrian BlackwaterStinson who agrees with plan of care.  Preterm labor symptoms and general obstetric precautions including but not limited to vaginal bleeding, contractions, leaking of fluid and fetal movement were reviewed in detail with the patient. Please refer to After Visit Summary for other counseling recommendations.  Return in about 2 weeks (around 03/27/2015).   Eino FarberWalidah Kennith GainN Karim, CNM

## 2015-03-13 NOTE — Progress Notes (Signed)
Pt here for initial OB - transferred from [facility] - Currently being treated for Gestational Diabetes Current complaints are constipation

## 2015-03-13 NOTE — Progress Notes (Signed)
Discussed in detail the inability to guarantee a CNM for birth.  Covered the two fellows currently with practice and their coverage of the call schedule.  Emphasized the group as a whole supports women being a partner in decisions and respect will be given regarding the desire of a low intervention as possible birth.

## 2015-03-22 NOTE — L&D Delivery Note (Signed)
Delivery Note  Patient presented in active labor s/p SROM and progressed spontaneously. Forebag ruptured at 19:15 and shortly thereafter patient felt urge to push. I was called and as I arrived in the room nursing was at patient's perineum and the infant delivered explosively into the bed.  At 7:27 PM a viable female was delivered via  (Presentation: OA ).  APGAR: 9/9 ; weight 3440 g.   Placenta status: intact.  Cord: 3 vessel.  with the following complications: none .  Cord pH: not obtained  Anesthesia:  none Episiotomy:  none Lacerations:  Small bilateral peri-uretheral hemostatic and not repaired Est. Blood Loss (mL):  150  Mom to postpartum.  Baby to Couplet care / Skin to Skin.  Brittney Moore 06/03/2015, 7:39 PM

## 2015-03-24 ENCOUNTER — Other Ambulatory Visit: Payer: Self-pay | Admitting: Family Medicine

## 2015-03-27 ENCOUNTER — Encounter: Payer: Self-pay | Admitting: Obstetrics & Gynecology

## 2015-03-27 ENCOUNTER — Ambulatory Visit (INDEPENDENT_AMBULATORY_CARE_PROVIDER_SITE_OTHER): Payer: BLUE CROSS/BLUE SHIELD | Admitting: Advanced Practice Midwife

## 2015-03-27 ENCOUNTER — Inpatient Hospital Stay (HOSPITAL_COMMUNITY)
Admission: AD | Admit: 2015-03-27 | Discharge: 2015-03-27 | Discharge status: Absent without leave | Payer: BLUE CROSS/BLUE SHIELD | Attending: Obstetrics | Admitting: Obstetrics

## 2015-03-27 VITALS — BP 115/75 | HR 83 | Wt 250.0 lb

## 2015-03-27 DIAGNOSIS — O0993 Supervision of high risk pregnancy, unspecified, third trimester: Secondary | ICD-10-CM

## 2015-03-27 DIAGNOSIS — Z20828 Contact with and (suspected) exposure to other viral communicable diseases: Secondary | ICD-10-CM

## 2015-03-27 DIAGNOSIS — Z23 Encounter for immunization: Secondary | ICD-10-CM

## 2015-03-27 DIAGNOSIS — O24419 Gestational diabetes mellitus in pregnancy, unspecified control: Secondary | ICD-10-CM

## 2015-03-27 LAB — CBC
HCT: 34.5 % — ABNORMAL LOW (ref 36.0–46.0)
Hemoglobin: 11.7 g/dL — ABNORMAL LOW (ref 12.0–15.0)
MCH: 29.2 pg (ref 26.0–34.0)
MCHC: 33.9 g/dL (ref 30.0–36.0)
MCV: 86 fL (ref 78.0–100.0)
MPV: 10.4 fL (ref 8.6–12.4)
PLATELETS: 211 10*3/uL (ref 150–400)
RBC: 4.01 MIL/uL (ref 3.87–5.11)
RDW: 13.1 % (ref 11.5–15.5)
WBC: 10.6 10*3/uL — ABNORMAL HIGH (ref 4.0–10.5)

## 2015-03-27 NOTE — Progress Notes (Signed)
Fasting CBGs: 87-102 (33% out of range) PCB:  80's-120's (95% out of range) PCL:  80's-120's (95% out of range) PCD: 80's-120's (95% out of range)  Subjective:  Brittney Moore is a 35 y.o. [id number] at 5931w6d being seen today for ongoing prenatal care.  She is currently monitored for the following issues for this high-risk pregnancy and has Prior pregnancy with 24 week twin fetal demise, antepartum; Sleep apnea; History of shoulder dystocia in prior pregnancy; Obesity; Post partum depression; History of preterm delivery; GDM, class A2; Supervision of high risk pregnancy, antepartum; Diabetes mellitus in pregnancy, antepartum; and AMA (advanced maternal age) multigravida 35+ on her problem list.  Patient reports Cold Sx.  Denies fever, chills.  Contractions: Irritability. Vag. Bleeding: None.  Movement: Present. Denies leaking of fluid.   Husband is concerned that daughter may have had Fifth Dz. Had fever early December and red cheeks after beign outside for a long time two weeks later. Wants pt tested. Pt has low concern and thinks daughter's cheeks were chapped (I agree).  The following portions of the patient's history were reviewed and updated as appropriate: allergies, current medications, past family history, past medical history, past social history, past surgical history and problem list. Problem list updated.  Objective:   Filed Vitals:   03/27/15 0845  BP: 115/75  Pulse: 83  Weight: 250 lb (113.399 kg)  Temp 98.1  Fetal Status: Fetal Heart Rate (bpm): 150 Fundal Height: 33 cm Movement: Present     General:  Alert, oriented and cooperative. Patient is in no acute distress.  Skin: Skin is warm and dry. No rash noted.   Cardiovascular: Normal heart rate noted  Respiratory: Normal respiratory effort, no problems with respiration noted  Abdomen: Soft, gravid, appropriate for gestational age. Pain/Pressure: Absent     Pelvic: Vag. Bleeding: None Vag D/C Character: Thin   Cervical  exam deferred        Extremities: Normal range of motion.  Edema: Trace  Mental Status: Normal mood and affect. Normal behavior. Normal judgment and thought content.   Urinalysis: Urine Protein: Trace Urine Glucose: Negative  Assessment and Plan:  Pregnancy: [id number] at 3331w6d  1. Gestational diabetes mellitus in third trimester, unspecified diabetic control  - CBC - HIV antibody (with reflex) - RPR - Parvovirus B19 Antibody, IGG and IGM - Tdap vaccine greater than or equal to 7yo IM  2. Parvovirus exposure  - CBC - HIV antibody (with reflex) - RPR - Parvovirus B19 Antibody, IGG and IGM  3. Supervision of high risk pregnancy, antepartum, third trimester   Preterm labor symptoms and general obstetric precautions including but not limited to vaginal bleeding, contractions, leaking of fluid and fetal movement were reviewed in detail with the patient. Please refer to After Visit Summary for other counseling recommendations.  Return in about 2 weeks (around 04/10/2015).  Start antenatal testing at 32 weeks. Third trimester growth US.   Dorathy KinsmanVirginia Ryosuke Ericksen, CNM

## 2015-03-27 NOTE — Patient Instructions (Addendum)

## 2015-03-27 NOTE — Progress Notes (Signed)
Pt states that she could have possibly been exposed to fifth's disease

## 2015-03-28 LAB — RPR

## 2015-03-28 LAB — HIV ANTIBODY (ROUTINE TESTING W REFLEX): HIV: NONREACTIVE

## 2015-03-31 ENCOUNTER — Other Ambulatory Visit: Payer: Self-pay | Admitting: Family

## 2015-03-31 ENCOUNTER — Ambulatory Visit (HOSPITAL_COMMUNITY)
Admission: RE | Admit: 2015-03-31 | Discharge: 2015-03-31 | Disposition: A | Discharge status: Home / Self Care | Payer: BLUE CROSS/BLUE SHIELD | Source: Ambulatory Visit | Attending: Family | Admitting: Family

## 2015-03-31 DIAGNOSIS — O09293 Supervision of pregnancy with other poor reproductive or obstetric history, third trimester: Secondary | ICD-10-CM

## 2015-03-31 DIAGNOSIS — O24415 Gestational diabetes mellitus in pregnancy, controlled by oral hypoglycemic drugs: Secondary | ICD-10-CM | POA: Insufficient documentation

## 2015-03-31 DIAGNOSIS — O99213 Obesity complicating pregnancy, third trimester: Secondary | ICD-10-CM | POA: Insufficient documentation

## 2015-03-31 DIAGNOSIS — O09213 Supervision of pregnancy with history of pre-term labor, third trimester: Secondary | ICD-10-CM

## 2015-03-31 DIAGNOSIS — Z3A30 30 weeks gestation of pregnancy: Secondary | ICD-10-CM

## 2015-03-31 DIAGNOSIS — Z36 Encounter for antenatal screening of mother: Secondary | ICD-10-CM | POA: Diagnosis not present

## 2015-03-31 DIAGNOSIS — O09523 Supervision of elderly multigravida, third trimester: Secondary | ICD-10-CM | POA: Insufficient documentation

## 2015-03-31 DIAGNOSIS — O09893 Supervision of other high risk pregnancies, third trimester: Secondary | ICD-10-CM

## 2015-03-31 DIAGNOSIS — O0992 Supervision of high risk pregnancy, unspecified, second trimester: Secondary | ICD-10-CM

## 2015-03-31 DIAGNOSIS — O24419 Gestational diabetes mellitus in pregnancy, unspecified control: Secondary | ICD-10-CM

## 2015-04-01 ENCOUNTER — Telehealth: Payer: Self-pay | Admitting: *Deleted

## 2015-04-01 LAB — PARVOVIRUS B19 ANTIBODY, IGG AND IGM
Parovirus B19 IgG Abs: 4.5 index — ABNORMAL HIGH (ref <0.9)
Parovirus B19 IgM Abs: 0.1 index (ref <0.9)

## 2015-04-01 NOTE — Telephone Encounter (Signed)
LM on voicemail of no recent exposure to 5th's disease but she does have the antibodies which means that she has been exposed some time in her lifetime.

## 2015-04-10 ENCOUNTER — Encounter: Payer: Self-pay | Admitting: Obstetrics & Gynecology

## 2015-04-10 ENCOUNTER — Ambulatory Visit (INDEPENDENT_AMBULATORY_CARE_PROVIDER_SITE_OTHER): Payer: BLUE CROSS/BLUE SHIELD | Admitting: Family

## 2015-04-10 ENCOUNTER — Encounter: Payer: Self-pay | Admitting: Family

## 2015-04-10 VITALS — BP 107/66 | HR 79 | Wt 252.0 lb

## 2015-04-10 DIAGNOSIS — O24419 Gestational diabetes mellitus in pregnancy, unspecified control: Secondary | ICD-10-CM

## 2015-04-10 DIAGNOSIS — O0993 Supervision of high risk pregnancy, unspecified, third trimester: Secondary | ICD-10-CM

## 2015-04-10 DIAGNOSIS — O09523 Supervision of elderly multigravida, third trimester: Secondary | ICD-10-CM

## 2015-04-10 NOTE — Progress Notes (Signed)
Subjective:  Brittney Moore is a 35 y.o. [id number] at [redacted]w[redacted]d being seen today for ongoing prenatal care.  She is currently monitored for the following issues for this high-risk pregnancy and has prior pregnancy with 24 week twin fetal demise, antepartum; Sleep apnea; History of shoulder dystocia in prior pregnancy; Obesity; Post partum depression; History of preterm delivery; GDM, class A2; and AMA (advanced maternal age) multigravida 35+ on her problem list.  Patient reports no complaints.  Expressed desire for waterbirth.  Verbalized frustration after learning that it was not possible due to being on meds.   Contractions: Irritability. Vag. Bleeding: None.  Movement: Present. Denies leaking of fluid.   The following portions of the patient's history were reviewed and updated as appropriate: allergies, current medications, past family history, past medical history, past social history, past surgical history and problem list. Problem list updated.  Objective:   Filed Vitals:   04/10/15 0817  BP: 107/66  Pulse: 79  Weight: 252 lb (114.306 kg)    Fetal Status: Fetal Heart Rate (bpm): 156 Fundal Height: 34 cm Movement: Present     General:  Alert, oriented and cooperative. Patient is in no acute distress.  Skin: Skin is warm and dry. No rash noted.   Cardiovascular: Normal heart rate noted  Respiratory: Normal respiratory effort, no problems with respiration noted  Abdomen: Soft, gravid, appropriate for gestational age. Pain/Pressure: Absent     Pelvic: Vag. Bleeding: None Vag D/C Character: Thin   Cervical exam deferred        Extremities: Normal range of motion.  Edema: Trace  Mental Status: Normal mood and affect. Normal behavior. Normal judgment and thought content.   Urinalysis: Urine Protein: Trace Urine Glucose: Negative  Fasting PPB PPL PPD  81-101, 7/17 abnl All wnl 80-128, 1/15 abnl All wnl   Assessment and Plan:  Pregnancy: [id number] at [redacted]w[redacted]d  1. Supervision of high risk  pregnancy, antepartum, third trimester - Continue monitoring - Obtain 10 wk ultrasound from [facility]; pt states LMP exact and that date should reflect that  2. GDM, class A2 - Reviewed growth ultrasound from 30 wks 75%ile  3. AMA (advanced maternal age) multigravida 35+, third trimester - Continue monitoring  Preterm labor symptoms and general obstetric precautions including but not limited to vaginal bleeding, contractions, leaking of fluid and fetal movement were reviewed in detail with the patient. Please refer to After Visit Summary for other counseling recommendations.  No Follow-up on file.   Eino Farber Kennith Gain, CNM

## 2015-04-13 ENCOUNTER — Encounter: Payer: Self-pay | Admitting: *Deleted

## 2015-04-24 ENCOUNTER — Encounter: Payer: Self-pay | Admitting: Obstetrics & Gynecology

## 2015-04-24 ENCOUNTER — Ambulatory Visit (INDEPENDENT_AMBULATORY_CARE_PROVIDER_SITE_OTHER): Payer: BLUE CROSS/BLUE SHIELD | Admitting: Family

## 2015-04-24 VITALS — BP 108/63 | HR 75 | Wt 256.0 lb

## 2015-04-24 DIAGNOSIS — O0993 Supervision of high risk pregnancy, unspecified, third trimester: Secondary | ICD-10-CM

## 2015-04-24 DIAGNOSIS — O24415 Gestational diabetes mellitus in pregnancy, controlled by oral hypoglycemic drugs: Secondary | ICD-10-CM | POA: Diagnosis not present

## 2015-04-24 DIAGNOSIS — O24419 Gestational diabetes mellitus in pregnancy, unspecified control: Secondary | ICD-10-CM

## 2015-04-24 NOTE — Progress Notes (Signed)
Subjective:  Brittney Moore is a 35 y.o. [id number] at [redacted]w[redacted]d being seen today for ongoing prenatal care.  She is currently monitored for the following issues for this high-risk pregnancy and has Prior pregnancy with 24 week twin fetal demise, antepartum; Sleep apnea; History of shoulder dystocia in prior pregnancy; Obesity; Post partum depression; History of preterm delivery; GDM, class A2; Supervision of high risk pregnancy, antepartum; Diabetes mellitus in pregnancy, antepartum; and AMA (advanced maternal age) multigravida 35+ on her problem list.  Patient reports no complaints.  Contractions: Irregular. Vag. Bleeding: None.  Movement: Present. Denies leaking of fluid.   The following portions of the patient's history were reviewed and updated as appropriate: allergies, current medications, past family history, past medical history, past social history, past surgical history and problem list. Problem list updated.  Objective:   Filed Vitals:   04/24/15 0807  BP: 108/63  Pulse: 75  Weight: 256 lb (116.121 kg)    Fetal Status: Fetal Heart Rate (bpm): NST-R   Movement: Present     General:  Alert, oriented and cooperative. Patient is in no acute distress.  Skin: Skin is warm and dry. No rash noted.   Cardiovascular: Normal heart rate noted  Respiratory: Normal respiratory effort, no problems with respiration noted  Abdomen: Soft, gravid, appropriate for gestational age. Pain/Pressure: Present     Pelvic: Vag. Bleeding: None Vag D/C Character: Thin   Cervical exam deferred        Extremities: Normal range of motion.  Edema: Trace  Mental Status: Normal mood and affect. Normal behavior. Normal judgment and thought content.   Urinalysis: Urine Protein: Trace Urine Glucose: Negative  Fasting PPB PPL PPD  83-101, 1/14 abnl 82-87, skipped breakfast 5/14 days 91-121, 1/14 abnl 80-121, 1/13 abnl    Assessment and Plan:  Pregnancy: [id number] at [redacted]w[redacted]d  1. Supervision of high risk pregnancy,  antepartum, third trimester - Fetal nonstress test - Amniotic fluid index - 19 cm  2. GDM, class A2 - Growth ultrasound in 4 weeks  Preterm labor symptoms and general obstetric precautions including but not limited to vaginal bleeding, contractions, leaking of fluid and fetal movement were reviewed in detail with the patient. Please refer to After Visit Summary for other counseling recommendations.  Return for NST 2/wk and appt in 2 weeks.   Eino Farber Kennith Gain, CNM

## 2015-04-28 ENCOUNTER — Ambulatory Visit (INDEPENDENT_AMBULATORY_CARE_PROVIDER_SITE_OTHER): Payer: BLUE CROSS/BLUE SHIELD | Admitting: *Deleted

## 2015-04-28 ENCOUNTER — Encounter: Payer: Self-pay | Admitting: Obstetrics & Gynecology

## 2015-04-28 VITALS — BP 113/74 | HR 83

## 2015-04-28 DIAGNOSIS — O0993 Supervision of high risk pregnancy, unspecified, third trimester: Secondary | ICD-10-CM

## 2015-04-28 DIAGNOSIS — O24415 Gestational diabetes mellitus in pregnancy, controlled by oral hypoglycemic drugs: Secondary | ICD-10-CM

## 2015-05-01 ENCOUNTER — Ambulatory Visit (INDEPENDENT_AMBULATORY_CARE_PROVIDER_SITE_OTHER): Payer: BLUE CROSS/BLUE SHIELD | Admitting: Family

## 2015-05-01 ENCOUNTER — Encounter: Payer: Self-pay | Admitting: Obstetrics & Gynecology

## 2015-05-01 VITALS — BP 115/69 | HR 81 | Wt 256.0 lb

## 2015-05-01 DIAGNOSIS — O24415 Gestational diabetes mellitus in pregnancy, controlled by oral hypoglycemic drugs: Secondary | ICD-10-CM | POA: Diagnosis not present

## 2015-05-01 DIAGNOSIS — O24419 Gestational diabetes mellitus in pregnancy, unspecified control: Secondary | ICD-10-CM

## 2015-05-01 DIAGNOSIS — O0993 Supervision of high risk pregnancy, unspecified, third trimester: Secondary | ICD-10-CM

## 2015-05-01 NOTE — Progress Notes (Signed)
Subjective:  Brittney Moore is a 35 y.o. [id number] at [redacted]w[redacted]d being seen today for ongoing prenatal care.  She is currently monitored for the following issues for this high-risk pregnancy and has Prior pregnancy with 24 week twin fetal demise, antepartum; Sleep apnea; History of shoulder dystocia in prior pregnancy; Obesity; Post partum depression; History of preterm delivery; GDM, class A2; Supervision of high risk pregnancy, antepartum; Diabetes mellitus in pregnancy, antepartum; and AMA (advanced maternal age) multigravida 35+ on her problem list.  Patient reports no complaints.  Contractions: Irregular. Vag. Bleeding: None.  Movement: Present. Denies leaking of fluid.   The following portions of the patient's history were reviewed and updated as appropriate: allergies, current medications, past family history, past medical history, past social history, past surgical history and problem list. Problem list updated.  Objective:   Filed Vitals:   05/01/15 0805  BP: 115/69  Pulse: 81  Weight: 256 lb (116.121 kg)    Fetal Status: Fetal Heart Rate (bpm): NST-R Fundal Height: 33 cm Movement: Present     General:  Alert, oriented and cooperative. Patient is in no acute distress.  Skin: Skin is warm and dry. No rash noted.   Cardiovascular: Normal heart rate noted  Respiratory: Normal respiratory effort, no problems with respiration noted  Abdomen: Soft, gravid, appropriate for gestational age. Pain/Pressure: Present     Pelvic: Vag. Bleeding: None Vag D/C Character: Thin   Cervical exam deferred        Extremities: Normal range of motion.  Edema: Trace  Mental Status: Normal mood and affect. Normal behavior. Normal judgment and thought content.   Urinalysis: Urine Protein: Negative Urine Glucose: Negative  Fasting PPB PPL PPD  wnl wnl wnl wnl   Assessment and Plan:  Pregnancy: [id number] at [redacted]w[redacted]d  1. Supervision of high risk pregnancy, antepartum, third trimester - Fetal nonstress test >  reactive  2. GDM, class A2 - Maintain current management - Fetal nonstress test  Preterm labor symptoms and general obstetric precautions including but not limited to vaginal bleeding, contractions, leaking of fluid and fetal movement were reviewed in detail with the patient. Please refer to After Visit Summary for other counseling recommendations.   Return Tuesday and Friday for NST; 2 wks appt  Marlis Edelson, CNM

## 2015-05-05 ENCOUNTER — Ambulatory Visit (INDEPENDENT_AMBULATORY_CARE_PROVIDER_SITE_OTHER): Payer: BLUE CROSS/BLUE SHIELD | Admitting: *Deleted

## 2015-05-05 VITALS — BP 118/67 | HR 81 | Wt 253.0 lb

## 2015-05-05 DIAGNOSIS — O24415 Gestational diabetes mellitus in pregnancy, controlled by oral hypoglycemic drugs: Secondary | ICD-10-CM | POA: Diagnosis not present

## 2015-05-05 DIAGNOSIS — O0993 Supervision of high risk pregnancy, unspecified, third trimester: Secondary | ICD-10-CM

## 2015-05-08 ENCOUNTER — Ambulatory Visit (INDEPENDENT_AMBULATORY_CARE_PROVIDER_SITE_OTHER): Payer: BLUE CROSS/BLUE SHIELD | Admitting: Advanced Practice Midwife

## 2015-05-08 ENCOUNTER — Encounter: Payer: Self-pay | Admitting: Obstetrics & Gynecology

## 2015-05-08 VITALS — BP 112/70 | HR 80 | Wt 256.0 lb

## 2015-05-08 DIAGNOSIS — O0993 Supervision of high risk pregnancy, unspecified, third trimester: Secondary | ICD-10-CM

## 2015-05-08 DIAGNOSIS — O09523 Supervision of elderly multigravida, third trimester: Secondary | ICD-10-CM | POA: Diagnosis not present

## 2015-05-08 DIAGNOSIS — O24415 Gestational diabetes mellitus in pregnancy, controlled by oral hypoglycemic drugs: Secondary | ICD-10-CM

## 2015-05-08 NOTE — Progress Notes (Signed)
Subjective:  Brittney Moore is a 35 y.o. [id number] at [redacted]w[redacted]d being seen today for ongoing prenatal care.  She is currently monitored for the following issues for this high-risk pregnancy and has Prior pregnancy with 24 week twin fetal demise, antepartum; Sleep apnea; History of shoulder dystocia in prior pregnancy; Obesity; Post partum depression; History of preterm delivery; GDM, class A2; Supervision of high risk pregnancy, antepartum; Diabetes mellitus in pregnancy, antepartum; and AMA (advanced maternal age) multigravida 35+ on her problem list.   Concerned abotu knowing when to go to hospital. Wants to labor in tub at home as long as possible since she is not a candidate for waterbirth, but has had difficulty knowing when she is about to deliver and has delivered quickly in the past.   Patient reports no complaints.  Contractions: Irregular. Vag. Bleeding: None.  Movement: Present. Denies leaking of fluid.   The following portions of the patient's history were reviewed and updated as appropriate: allergies, current medications, past family history, past medical history, past social history, past surgical history and problem list. Problem list updated.  Objective:   Filed Vitals:   05/08/15 0804  BP: 112/70  Pulse: 80  Weight: 256 lb (116.121 kg)    Fetal Status: Fetal Heart Rate (bpm): NST-R   Movement: Present  Presentation: Vertex  General:  Alert, oriented and cooperative. Patient is in no acute distress.  Skin: Skin is warm and dry. No rash noted.   Cardiovascular: Normal heart rate noted  Respiratory: Normal respiratory effort, no problems with respiration noted  Abdomen: Soft, gravid, appropriate for gestational age. Pain/Pressure: Present     Pelvic: Vag. Bleeding: None Vag D/C Character: Thin   Cervical exam deferred        Extremities: Normal range of motion.  Edema: Trace  Mental Status: Normal mood and affect. Normal behavior. Normal judgment and thought content.    Urinalysis:     Normal  Fasting CBGs: 69-101 (1/17 out of range) PCB:  75-112 (1/16 out of range) PCL:  90-123 (1/16 out of range) PCD: 109-120 (0 out of range)   Assessment and Plan:  Pregnancy: [id number] at [redacted]w[redacted]d  1. Supervision of high risk pregnancy, antepartum, third trimester  - Fetal nonstress test - Amniotic fluid index  Preterm labor symptoms and general obstetric precautions including but not limited to vaginal bleeding, contractions, leaking of fluid and fetal movement were reviewed in detail with the patient. Please refer to After Visit Summary for other counseling recommendations.  Encouraged to err on the side of not waiting until the last minute to drive to hospital due to Hx of precip birth, shoulder dystocia, GDM and multiparity. Increased risk of PPH and SD. Return for Continue twice weekly testing.   Dorathy Kinsman, CNM

## 2015-05-08 NOTE — Patient Instructions (Signed)
Braxton Hicks Contractions °Contractions of the uterus can occur throughout pregnancy. Contractions are not always a sign that you are in labor.  °WHAT ARE BRAXTON HICKS CONTRACTIONS?  °Contractions that occur before labor are called Braxton Hicks contractions, or false labor. Toward the end of pregnancy (32-34 weeks), these contractions can develop more often and may become more forceful. This is not true labor because these contractions do not result in opening (dilatation) and thinning of the cervix. They are sometimes difficult to tell apart from true labor because these contractions can be forceful and people have different pain tolerances. You should not feel embarrassed if you go to the hospital with false labor. Sometimes, the only way to tell if you are in true labor is for your health care provider to look for changes in the cervix. °If there are no prenatal problems or other health problems associated with the pregnancy, it is completely safe to be sent home with false labor and await the onset of true labor. °HOW CAN YOU TELL THE DIFFERENCE BETWEEN TRUE AND FALSE LABOR? °False Labor °· The contractions of false labor are usually shorter and not as hard as those of true labor.   °· The contractions are usually irregular.   °· The contractions are often felt in the front of the lower abdomen and in the groin.   °· The contractions may go away when you walk around or change positions while lying down.   °· The contractions get weaker and are shorter lasting as time goes on.   °· The contractions do not usually become progressively stronger, regular, and closer together as with true labor.   °True Labor °1. Contractions in true labor last 30-70 seconds, become very regular, usually become more intense, and increase in frequency.   °2. The contractions do not go away with walking.   °3. The discomfort is usually felt in the top of the uterus and spreads to the lower abdomen and low back.   °4. True labor can  be determined by your health care provider with an exam. This will show that the cervix is dilating and getting thinner.   °WHAT TO REMEMBER °· Keep up with your usual exercises and follow other instructions given by your health care provider.   °· Take medicines as directed by your health care provider.   °· Keep your regular prenatal appointments.   °· Eat and drink lightly if you think you are going into labor.   °· If Braxton Hicks contractions are making you uncomfortable:   °· Change your position from lying down or resting to walking, or from walking to resting.   °· Sit and rest in a tub of warm water.   °· Drink 2-3 glasses of water. Dehydration may cause these contractions.   °· Do slow and deep breathing several times an hour.   °WHEN SHOULD I SEEK IMMEDIATE MEDICAL CARE? °Seek immediate medical care if: °· Your contractions become stronger, more regular, and closer together.   °· You have fluid leaking or gushing from your vagina.   °· You have a fever.   °· You pass blood-tinged mucus.   °· You have vaginal bleeding.   °· You have continuous abdominal pain.   °· You have low back pain that you never had before.   °· You feel your baby's head pushing down and causing pelvic pressure.   °· Your baby is not moving as much as it used to.   °  °This information is not intended to replace advice given to you by your health care provider. Make sure you discuss any questions you have with your health care   provider. °  °Document Released: 03/07/2005 Document Revised: 03/12/2013 Document Reviewed: 12/17/2012 °Elsevier Interactive Patient Education ©2016 [employer] ° °Fetal Movement Counts °Patient Name: __________________________________________________ Patient Due Date: ____________________ °Performing a fetal movement count is highly recommended in high-risk pregnancies, but it is good for every pregnant woman to do. Your health care provider may ask you to start counting fetal movements at 28 weeks of the  pregnancy. Fetal movements often increase: °· After eating a full meal. °· After physical activity. °· After eating or drinking something sweet or cold. °· At rest. °Pay attention to when you feel the baby is most active. This will help you notice a pattern of your baby's sleep and wake cycles and what factors contribute to an increase in fetal movement. It is important to perform a fetal movement count at the same time each day when your baby is normally most active.  °HOW TO COUNT FETAL MOVEMENTS °5. Find a quiet and comfortable area to sit or lie down on your left side. Lying on your left side provides the best blood and oxygen circulation to your baby. °6. Write down the day and time on a sheet of paper or in a journal. °7. Start counting kicks, flutters, swishes, rolls, or jabs in a 2-hour period. You should feel at least 10 movements within 2 hours. °8. If you do not feel 10 movements in 2 hours, wait 2-3 hours and count again. Look for a change in the pattern or not enough counts in 2 hours. °SEEK MEDICAL CARE IF: °· You feel less than 10 counts in 2 hours, tried twice. °· There is no movement in over an hour. °· The pattern is changing or taking longer each day to reach 10 counts in 2 hours. °· You feel the baby is not moving as he or she usually does. °Date: ____________ Movements: ____________ Start time: ____________ Finish time: ____________  °Date: ____________ Movements: ____________ Start time: ____________ Finish time: ____________ °Date: ____________ Movements: ____________ Start time: ____________ Finish time: ____________ °Date: ____________ Movements: ____________ Start time: ____________ Finish time: ____________ °Date: ____________ Movements: ____________ Start time: ____________ Finish time: ____________ °Date: ____________ Movements: ____________ Start time: ____________ Finish time: ____________ °Date: ____________ Movements: ____________ Start time: ____________ Finish time:  ____________ °Date: ____________ Movements: ____________ Start time: ____________ Finish time: ____________  °Date: ____________ Movements: ____________ Start time: ____________ Finish time: ____________ °Date: ____________ Movements: ____________ Start time: ____________ Finish time: ____________ °Date: ____________ Movements: ____________ Start time: ____________ Finish time: ____________ °Date: ____________ Movements: ____________ Start time: ____________ Finish time: ____________ °Date: ____________ Movements: ____________ Start time: ____________ Finish time: ____________ °Date: ____________ Movements: ____________ Start time: ____________ Finish time: ____________ °Date: ____________ Movements: ____________ Start time: ____________ Finish time: ____________  °Date: ____________ Movements: ____________ Start time: ____________ Finish time: ____________ °Date: ____________ Movements: ____________ Start time: ____________ Finish time: ____________ °Date: ____________ Movements: ____________ Start time: ____________ Finish time: ____________ °Date: ____________ Movements: ____________ Start time: ____________ Finish time: ____________ °Date: ____________ Movements: ____________ Start time: ____________ Finish time: ____________ °Date: ____________ Movements: ____________ Start time: ____________ Finish time: ____________ °Date: ____________ Movements: ____________ Start time: ____________ Finish time: ____________  °Date: ____________ Movements: ____________ Start time: ____________ Finish time: ____________ °Date: ____________ Movements: ____________ Start time: ____________ Finish time: ____________ °Date: ____________ Movements: ____________ Start time: ____________ Finish time: ____________ °Date: ____________ Movements: ____________ Start time: ____________ Finish time: ____________ °Date: ____________ Movements: ____________ Start time: ____________ Finish time: ____________ °Date: ____________ Movements:  ____________ Start time: ____________ Finish   time: ____________ °Date: ____________ Movements: ____________ Start time: ____________ Finish time: ____________  °Date: ____________ Movements: ____________ Start time: ____________ Finish time: ____________ °Date: ____________ Movements: ____________ Start time: ____________ Finish time: ____________ °Date: ____________ Movements: ____________ Start time: ____________ Finish time: ____________ °Date: ____________ Movements: ____________ Start time: ____________ Finish time: ____________ °Date: ____________ Movements: ____________ Start time: ____________ Finish time: ____________ °Date: ____________ Movements: ____________ Start time: ____________ Finish time: ____________ °Date: ____________ Movements: ____________ Start time: ____________ Finish time: ____________  °Date: ____________ Movements: ____________ Start time: ____________ Finish time: ____________ °Date: ____________ Movements: ____________ Start time: ____________ Finish time: ____________ °Date: ____________ Movements: ____________ Start time: ____________ Finish time: ____________ °Date: ____________ Movements: ____________ Start time: ____________ Finish time: ____________ °Date: ____________ Movements: ____________ Start time: ____________ Finish time: ____________ °Date: ____________ Movements: ____________ Start time: ____________ Finish time: ____________ °Date: ____________ Movements: ____________ Start time: ____________ Finish time: ____________  °Date: ____________ Movements: ____________ Start time: ____________ Finish time: ____________ °Date: ____________ Movements: ____________ Start time: ____________ Finish time: ____________ °Date: ____________ Movements: ____________ Start time: ____________ Finish time: ____________ °Date: ____________ Movements: ____________ Start time: ____________ Finish time: ____________ °Date: ____________ Movements: ____________ Start time: ____________ Finish  time: ____________ °Date: ____________ Movements: ____________ Start time: ____________ Finish time: ____________ °Date: ____________ Movements: ____________ Start time: ____________ Finish time: ____________  °Date: ____________ Movements: ____________ Start time: ____________ Finish time: ____________ °Date: ____________ Movements: ____________ Start time: ____________ Finish time: ____________ °Date: ____________ Movements: ____________ Start time: ____________ Finish time: ____________ °Date: ____________ Movements: ____________ Start time: ____________ Finish time: ____________ °Date: ____________ Movements: ____________ Start time: ____________ Finish time: ____________ °Date: ____________ Movements: ____________ Start time: ____________ Finish time: ____________ °  °This information is not intended to replace advice given to you by your health care provider. Make sure you discuss any questions you have with your health care provider. °  °Document Released: 04/06/2006 Document Revised: 03/28/2014 Document Reviewed: 01/02/2012 °Elsevier Interactive Patient Education ©2016 [employer] ° °

## 2015-05-12 ENCOUNTER — Ambulatory Visit (INDEPENDENT_AMBULATORY_CARE_PROVIDER_SITE_OTHER): Payer: BLUE CROSS/BLUE SHIELD | Admitting: *Deleted

## 2015-05-12 VITALS — BP 113/68 | HR 73 | Wt 257.0 lb

## 2015-05-12 DIAGNOSIS — O24415 Gestational diabetes mellitus in pregnancy, controlled by oral hypoglycemic drugs: Secondary | ICD-10-CM

## 2015-05-12 DIAGNOSIS — O0993 Supervision of high risk pregnancy, unspecified, third trimester: Secondary | ICD-10-CM

## 2015-05-15 ENCOUNTER — Encounter: Payer: Self-pay | Admitting: Obstetrics & Gynecology

## 2015-05-15 ENCOUNTER — Other Ambulatory Visit: Payer: Self-pay | Admitting: Family

## 2015-05-15 ENCOUNTER — Ambulatory Visit (INDEPENDENT_AMBULATORY_CARE_PROVIDER_SITE_OTHER): Payer: BLUE CROSS/BLUE SHIELD | Admitting: Family

## 2015-05-15 VITALS — BP 115/73 | HR 82 | Wt 260.0 lb

## 2015-05-15 DIAGNOSIS — O24419 Gestational diabetes mellitus in pregnancy, unspecified control: Secondary | ICD-10-CM

## 2015-05-15 DIAGNOSIS — O0993 Supervision of high risk pregnancy, unspecified, third trimester: Secondary | ICD-10-CM

## 2015-05-15 DIAGNOSIS — Z36 Encounter for antenatal screening of mother: Secondary | ICD-10-CM

## 2015-05-15 NOTE — Progress Notes (Signed)
Subjective:  Brittney Moore is a 35 y.o. [id number] at [redacted]w[redacted]d being seen today for ongoing prenatal care.  She is currently monitored for the following issues for this high-risk pregnancy and has Prior pregnancy with 24 week twin fetal demise, antepartum; Sleep apnea; History of shoulder dystocia in prior pregnancy; Obesity; Post partum depression; History of preterm delivery; GDM, class A2; Supervision of high risk pregnancy, antepartum; Diabetes mellitus in pregnancy, antepartum; and AMA (advanced maternal age) multigravida 35+ on her problem list.  Patient reports no complaints.  Contractions: Irregular. Vag. Bleeding: None.  Movement: Present. Denies leaking of fluid.   The following portions of the patient's history were reviewed and updated as appropriate: allergies, current medications, past family history, past medical history, past social history, past surgical history and problem list. Problem list updated.  Objective:   Filed Vitals:   05/15/15 0805  BP: 115/73  Pulse: 82  Weight: 260 lb (117.935 kg)    Fetal Status: Fetal Heart Rate (bpm): NST-R   Movement: Present  Presentation: Vertex  General:  Alert, oriented and cooperative. Patient is in no acute distress.  Skin: Skin is warm and dry. No rash noted.   Cardiovascular: Normal heart rate noted  Respiratory: Normal respiratory effort, no problems with respiration noted  Abdomen: Soft, gravid, appropriate for gestational age. Pain/Pressure: Present     Pelvic: Vag. Bleeding: None Vag D/C Character: Thin   Cervical exam deferred Dilation: 1 Effacement (%): Thick    Extremities: Normal range of motion.  Edema: Very deep pitting, indentation lasts a long time  Mental Status: Normal mood and affect. Normal behavior. Normal judgment and thought content.   Urinalysis: Urine Protein: Negative Urine Glucose: Negative  CGG all wnl Assessment and Plan:  Pregnancy: [id number] at [redacted]w[redacted]d  1. Supervision of high risk pregnancy,  antepartum, third trimester - Culture, beta strep (group b only) - GC/Chlamydia Probe Amp - Amniotic fluid index with NST  2. GDM, class A2 - CBG all wnl - Continue 2x/week testing - Growth ultrasound on 05/21/15  Preterm labor symptoms and general obstetric precautions including but not limited to vaginal bleeding, contractions, leaking of fluid and fetal movement were reviewed in detail with the patient. Please refer to After Visit Summary for other counseling recommendations.  Return for NST 2/wk and appt weekly.   Eino Farber Kennith Gain, CNM

## 2015-05-16 LAB — GC/CHLAMYDIA PROBE AMP
CT Probe RNA: NOT DETECTED
GC PROBE AMP APTIMA: NOT DETECTED

## 2015-05-17 LAB — CULTURE, BETA STREP (GROUP B ONLY)

## 2015-05-17 LAB — OB RESULTS CONSOLE GBS: STREP GROUP B AG: NEGATIVE

## 2015-05-19 ENCOUNTER — Encounter: Payer: Self-pay | Admitting: Obstetrics & Gynecology

## 2015-05-19 ENCOUNTER — Ambulatory Visit: Payer: BLUE CROSS/BLUE SHIELD | Admitting: *Deleted

## 2015-05-19 VITALS — BP 127/86 | HR 76 | Wt 260.0 lb

## 2015-05-19 DIAGNOSIS — O24419 Gestational diabetes mellitus in pregnancy, unspecified control: Secondary | ICD-10-CM

## 2015-05-19 NOTE — Progress Notes (Signed)
NST-R   AFI-15.3

## 2015-05-21 ENCOUNTER — Ambulatory Visit (HOSPITAL_COMMUNITY)
Admission: RE | Admit: 2015-05-21 | Discharge: 2015-05-21 | Disposition: A | Discharge status: Home / Self Care | Payer: BLUE CROSS/BLUE SHIELD | Source: Ambulatory Visit | Attending: Family | Admitting: Family

## 2015-05-21 ENCOUNTER — Other Ambulatory Visit: Payer: Self-pay | Admitting: Family

## 2015-05-21 DIAGNOSIS — O09213 Supervision of pregnancy with history of pre-term labor, third trimester: Secondary | ICD-10-CM | POA: Insufficient documentation

## 2015-05-21 DIAGNOSIS — O3663X0 Maternal care for excessive fetal growth, third trimester, not applicable or unspecified: Secondary | ICD-10-CM

## 2015-05-21 DIAGNOSIS — O09523 Supervision of elderly multigravida, third trimester: Secondary | ICD-10-CM | POA: Diagnosis not present

## 2015-05-21 DIAGNOSIS — Z36 Encounter for antenatal screening of mother: Secondary | ICD-10-CM | POA: Insufficient documentation

## 2015-05-21 DIAGNOSIS — Z3A36 36 weeks gestation of pregnancy: Secondary | ICD-10-CM | POA: Diagnosis not present

## 2015-05-21 DIAGNOSIS — O99213 Obesity complicating pregnancy, third trimester: Secondary | ICD-10-CM

## 2015-05-21 DIAGNOSIS — O24419 Gestational diabetes mellitus in pregnancy, unspecified control: Secondary | ICD-10-CM

## 2015-05-21 DIAGNOSIS — O24415 Gestational diabetes mellitus in pregnancy, controlled by oral hypoglycemic drugs: Secondary | ICD-10-CM | POA: Insufficient documentation

## 2015-05-21 DIAGNOSIS — IMO0002 Reserved for concepts with insufficient information to code with codable children: Secondary | ICD-10-CM

## 2015-05-21 DIAGNOSIS — Z0489 Encounter for examination and observation for other specified reasons: Secondary | ICD-10-CM

## 2015-05-21 DIAGNOSIS — O09893 Supervision of other high risk pregnancies, third trimester: Secondary | ICD-10-CM

## 2015-05-22 ENCOUNTER — Encounter: Payer: Self-pay | Admitting: Obstetrics & Gynecology

## 2015-05-22 ENCOUNTER — Ambulatory Visit (INDEPENDENT_AMBULATORY_CARE_PROVIDER_SITE_OTHER): Payer: BLUE CROSS/BLUE SHIELD | Admitting: Advanced Practice Midwife

## 2015-05-22 VITALS — BP 121/71 | HR 74 | Wt 262.0 lb

## 2015-05-22 DIAGNOSIS — O24419 Gestational diabetes mellitus in pregnancy, unspecified control: Secondary | ICD-10-CM | POA: Diagnosis not present

## 2015-05-22 DIAGNOSIS — O0993 Supervision of high risk pregnancy, unspecified, third trimester: Secondary | ICD-10-CM

## 2015-05-22 IMAGING — US US OB FOLLOW-UP
1 series · 12 of 28 positions shown · non-contrast
Comparison: none

[Series 1: us ob follow up · 12 of 43 slices shown]
[im 2/43]
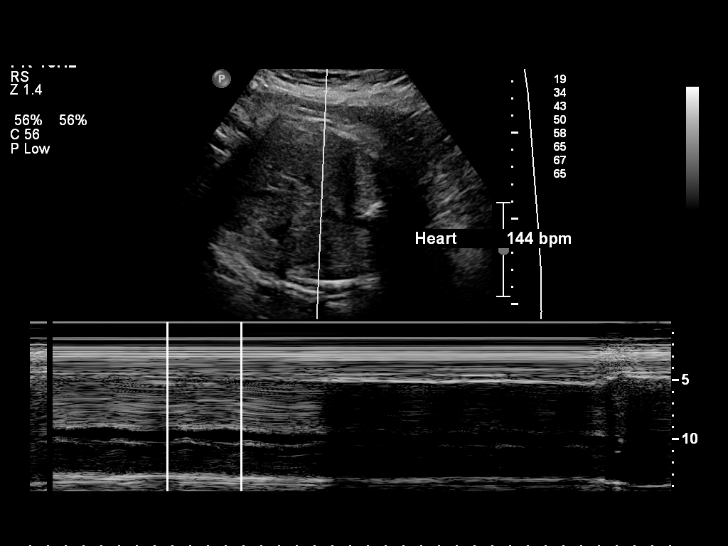
[im 5/43]
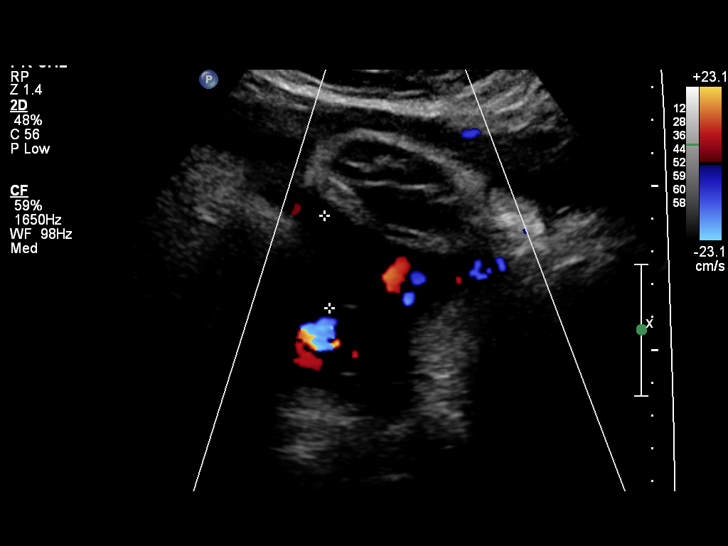
[im 8/43]
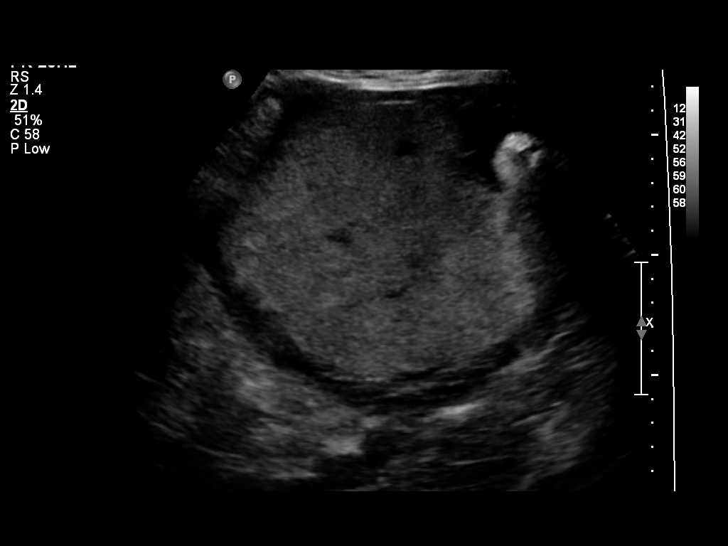
[im 13/43]
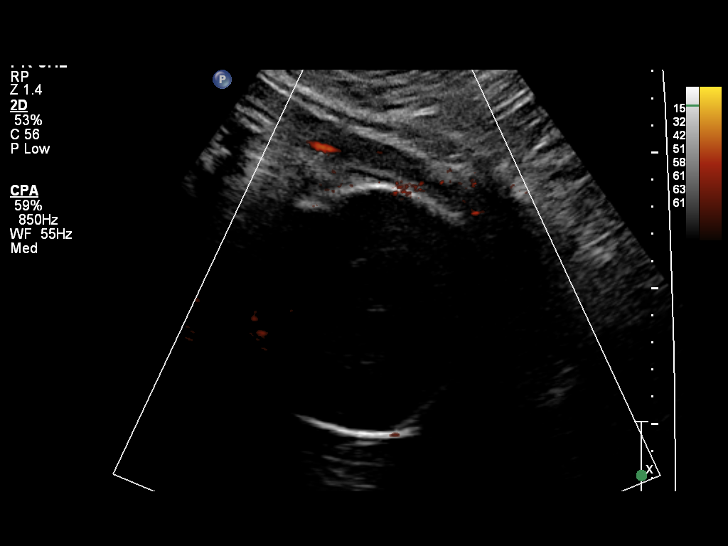
[im 16/43]
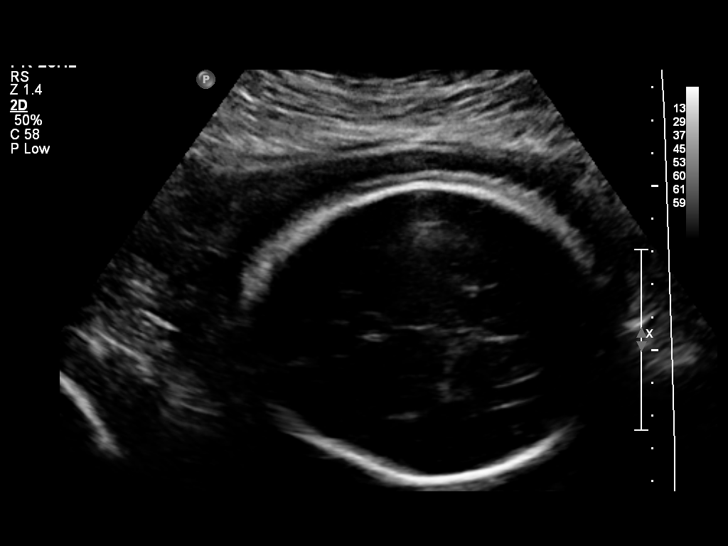
[im 19/43]
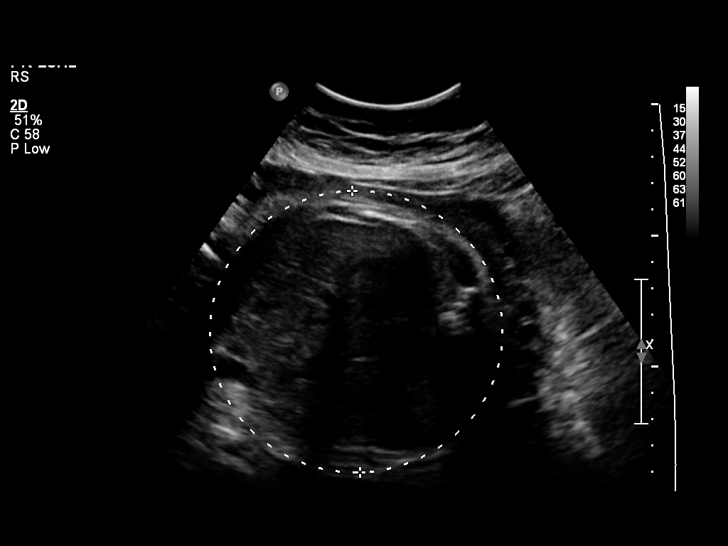
[im 24/43]
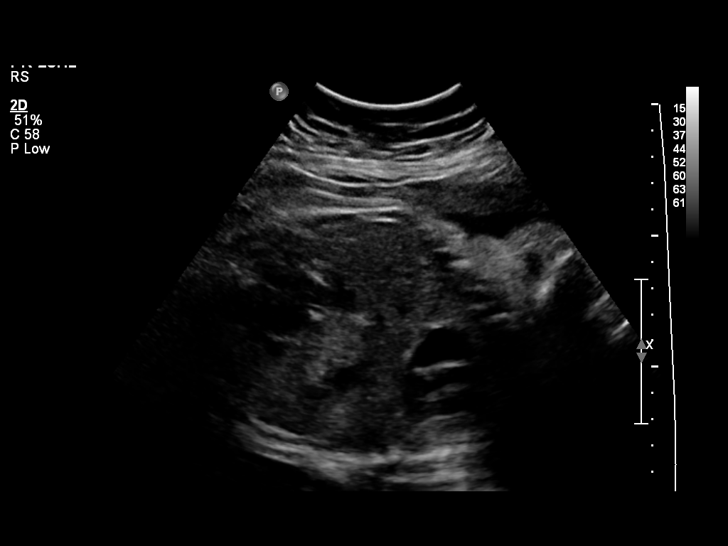
[im 27/43]
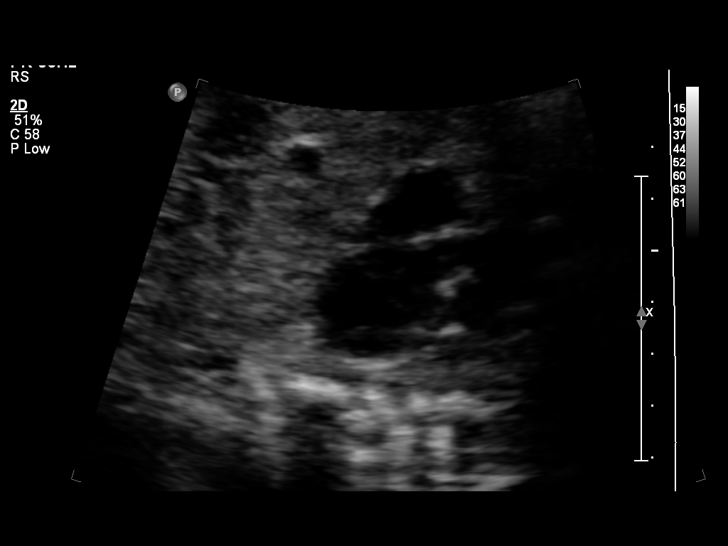
[im 30/43]
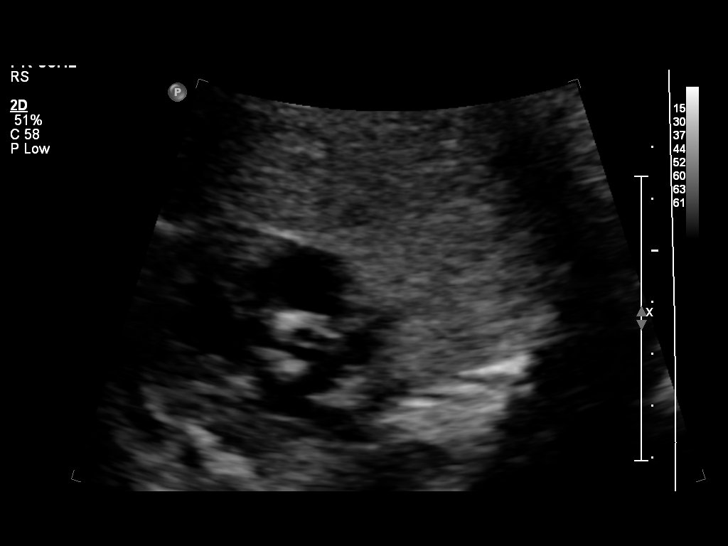
[im 35/43]
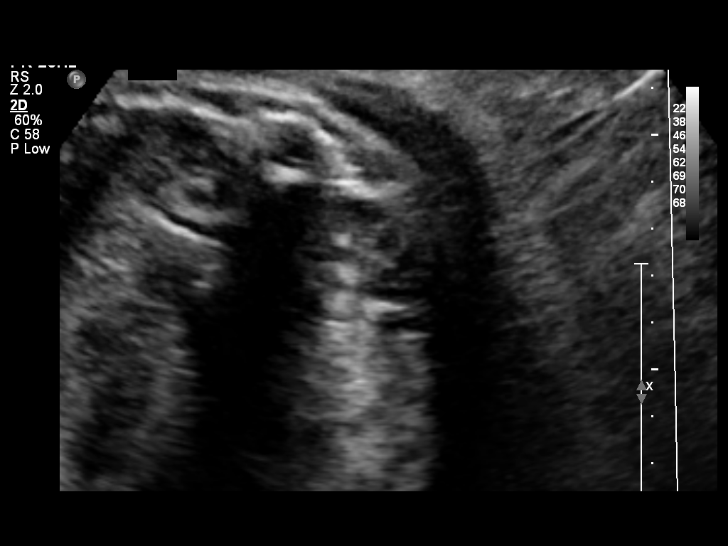
[im 38/43]
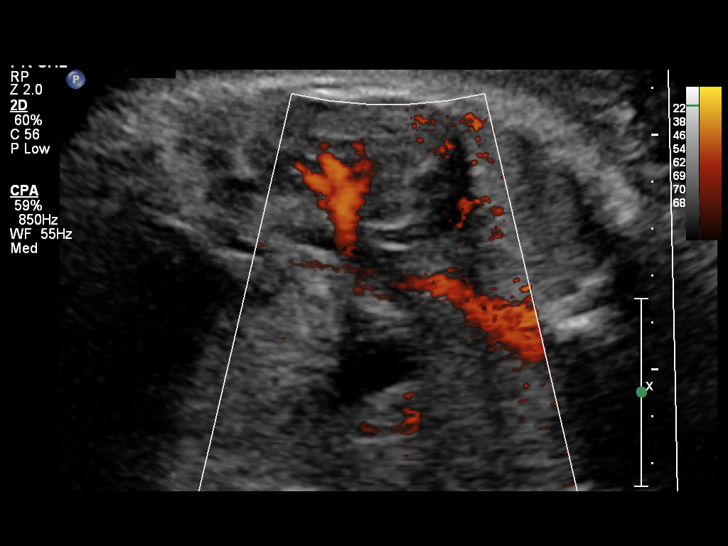
[im 41/43]
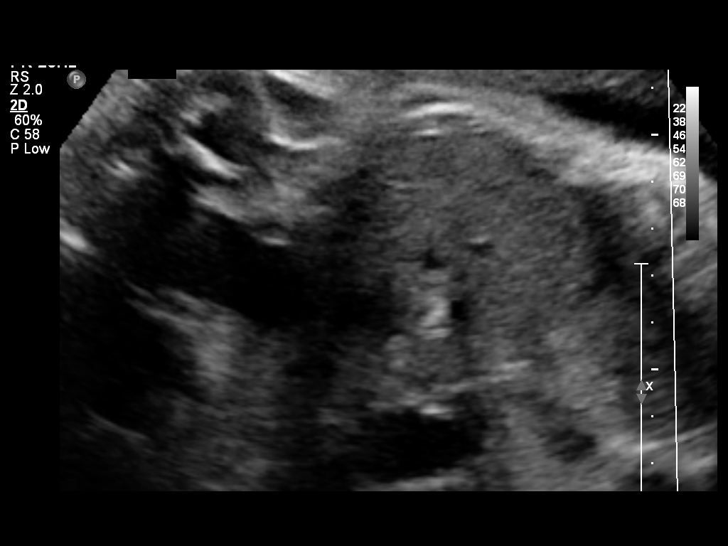

[12 of 28 positions shown; findings below may reference images not displayed]

OBSTETRICS REPORT
                      (Signed Final 03/05/2014 [DATE])

                                                         CNM
Service(s) Provided

 US OB FOLLOW UP                                       76816.1
Indications

 34 weeks gestation of pregnancy
 Size greater than dates (Large for gestational age)
 Maternal morbid obesity
Fetal Evaluation

 Num Of Fetuses:    1
 Fetal Heart Rate:  144                          bpm
 Cardiac Activity:  Observed
 Presentation:      Cephalic
 Placenta:          Posterior, above cervical
                    os
 P. Cord            Visualized, central
 Insertion:

 Amniotic Fluid
 AFI FV:      Subjectively within normal limits
 AFI Sum:     14.13   cm       49  %Tile     Larg Pckt:    4.45  cm
 RUQ:   3.22    cm   RLQ:    3.65   cm    LUQ:   4.45    cm   LLQ:    2.81   cm
Biometry

 BPD:     89.5  mm     G. Age:  36w 2d                CI:        76.75   70 - 86
                                                      FL/HC:      20.5   19.4 -

 HC:     323.6  mm     G. Age:  36w 4d       78  %    HC/AC:      0.94   0.96 -

 AC:     342.7  mm     G. Age:  38w 1d     > 97  %    FL/BPD:     74.2   71 - 87
 FL:      66.4  mm     G. Age:  34w 1d       45  %    FL/AC:      19.4   20 - 24

 Est. FW:    1435  gm    6 lb 11 oz    > 90  %
Gestational Age

 LMP:           34w 0d        Date:  07/10/13                 EDD:   04/16/14
 U/S Today:     36w 2d                                        EDD:   03/31/14
 Best:          34w 0d     Det. By:  LMP  (07/10/13)          EDD:   04/16/14
Anatomy
 Cranium:          Appears normal         Aortic Arch:      Previously seen
 Fetal Cavum:      Appears normal         Ductal Arch:      Previously seen
 Ventricles:       Appears normal         Diaphragm:        Appears normal
 Choroid Plexus:   Previously seen        Stomach:          Appears normal, left
                                                            sided
 Cerebellum:       Previously seen        Abdomen:          Appears normal
 Posterior Fossa:  Previously seen        Abdominal Wall:   Previously seen
 Nuchal Fold:      Previously seen        Cord Vessels:     Previously seen
 Face:             Orbits and profile     Kidneys:          Appear normal
                   previously seen
 Lips:             Previously seen        Bladder:          Appears normal
 Heart:            Appears normal         Spine:            Previously seen
                   (4CH, axis, and
                   situs)
 RVOT:             Appears normal         Lower             Previously seen
                                          Extremities:
 LVOT:             Appears normal         Upper             Previously seen
                                          Extremities:

 Other:  Female gender. Technically difficult due to maternal habitus and fetal
         position.
Targeted Anatomy

 Fetal Central Nervous System
 Lat. Ventricles:
Cervix Uterus Adnexa

 Cervix:       Not visualized (advanced GA >49wks)
Impression

 Single IUP at 34w 0d weeks
 The estimated fetal weight today is > 90th %tile.  The AC is >
 97th %tile (1435 g)
 Posterior placenta without previa
 Normal amniotic fluid volume
Recommendations

 At increased risk for LGA infant - consider repeat ultrasound
 in approximatley 3 weeks
 Would follow labor course closely.

## 2015-05-22 NOTE — Progress Notes (Signed)
Subjective:  Brittney Moore is a 35 y.o. [id number] at 6227w6d being seen today for ongoing prenatal care.  She is currently monitored for the following issues for this high-risk pregnancy and has Prior pregnancy with 24 week twin fetal demise, antepartum; Sleep apnea; History of shoulder dystocia in prior pregnancy; Obesity; Post partum depression; History of preterm delivery; GDM, class A2; Supervision of high risk pregnancy, antepartum; Diabetes mellitus in pregnancy, antepartum; and AMA (advanced maternal age) multigravida 35+ on her problem list.  Patient reports contractions last night, painful and close together but improved this morning.  Contractions: Irregular. Vag. Bleeding: None.  Movement: Present. Denies leaking of fluid.   The following portions of the patient's history were reviewed and updated as appropriate: allergies, current medications, past family history, past medical history, past social history, past surgical history and problem list. Problem list updated.  Objective:   Filed Vitals:   05/22/15 0754  BP: 121/71  Pulse: 74  Weight: 262 lb (118.842 kg)    Fetal Status: Fetal Heart Rate (bpm): NST Fundal Height: 40 cm Movement: Present  Presentation: Vertex  General:  Alert, oriented and cooperative. Patient is in no acute distress.  Skin: Skin is warm and dry. No rash noted.   Cardiovascular: Normal heart rate noted  Respiratory: Normal respiratory effort, no problems with respiration noted  Abdomen: Soft, gravid, appropriate for gestational age. Pain/Pressure: Present     Pelvic: Vag. Bleeding: None Vag D/C Character: Thin   Cervical exam performed Dilation: 3.5 Effacement (%): 50 Station: -3  Extremities: Normal range of motion.  Edema: Mild pitting, slight indentation  Mental Status: Normal mood and affect. Normal behavior. Normal judgment and thought content.   Urinalysis: Urine Protein: Negative Urine Glucose: Negative  Assessment and Plan:  Pregnancy:  [id number] at 6727w6d  1. Gestational diabetes mellitus in third trimester, unspecified diabetic control  - Fetal nonstress test; Future --BS Log (F: 73-84, PP <120), 2 fastings BS in 70s this week, felt nauseous with one.  Recommend increase protein at bedtime, call office if continue to be low and/or symptomatic  2. Supervision of high risk pregnancy, antepartum, third trimester - Fetal nonstress test  Term labor symptoms and general obstetric precautions including but not limited to vaginal bleeding, contractions, leaking of fluid and fetal movement were reviewed in detail with the patient. Please refer to After Visit Summary for other counseling recommendations.  Return in about 1 week (around 05/29/2015).   Hurshel PartyLisa A Leftwich-Kirby, CNM

## 2015-05-24 DIAGNOSIS — O3660X0 Maternal care for excessive fetal growth, unspecified trimester, not applicable or unspecified: Secondary | ICD-10-CM | POA: Insufficient documentation

## 2015-05-26 ENCOUNTER — Encounter: Payer: Self-pay | Admitting: Obstetrics & Gynecology

## 2015-05-26 ENCOUNTER — Ambulatory Visit (INDEPENDENT_AMBULATORY_CARE_PROVIDER_SITE_OTHER): Payer: BLUE CROSS/BLUE SHIELD | Admitting: *Deleted

## 2015-05-26 VITALS — BP 119/75 | HR 73 | Wt 260.0 lb

## 2015-05-26 DIAGNOSIS — O24419 Gestational diabetes mellitus in pregnancy, unspecified control: Secondary | ICD-10-CM | POA: Diagnosis not present

## 2015-05-29 ENCOUNTER — Encounter: Payer: Self-pay | Admitting: Obstetrics & Gynecology

## 2015-05-29 ENCOUNTER — Ambulatory Visit (INDEPENDENT_AMBULATORY_CARE_PROVIDER_SITE_OTHER): Payer: BLUE CROSS/BLUE SHIELD | Admitting: Family

## 2015-05-29 VITALS — BP 119/71 | HR 75 | Wt 262.0 lb

## 2015-05-29 DIAGNOSIS — O3663X0 Maternal care for excessive fetal growth, third trimester, not applicable or unspecified: Secondary | ICD-10-CM

## 2015-05-29 DIAGNOSIS — O24913 Unspecified diabetes mellitus in pregnancy, third trimester: Secondary | ICD-10-CM

## 2015-05-29 DIAGNOSIS — O0993 Supervision of high risk pregnancy, unspecified, third trimester: Secondary | ICD-10-CM

## 2015-05-29 NOTE — Progress Notes (Signed)
Subjective:  Brittney Moore is a 35 y.o. [id number] at 5671w6d being seen today for ongoing prenatal care.  She is currently monitored for the following issues for this high-risk pregnancy and has Prior pregnancy with 24 week twin fetal demise, antepartum; Sleep apnea; History of shoulder dystocia in prior pregnancy; Obesity; Post partum depression; History of preterm delivery; GDM, class A2; Supervision of high risk pregnancy, antepartum; Diabetes mellitus in pregnancy, antepartum; AMA (advanced maternal age) multigravida 35+; and LGA (large for gestational age) fetus affecting mother, antepartum on her problem list.  Patient reports occasional contractions.  Contractions: Irregular. Vag. Bleeding: None.  Movement: Present. Denies leaking of fluid.   The following portions of the patient's history were reviewed and updated as appropriate: allergies, current medications, past family history, past medical history, past social history, past surgical history and problem list. Problem list updated.  Objective:   Filed Vitals:   05/29/15 0803  BP: 119/71  Pulse: 75  Weight: 262 lb (118.842 kg)    Fetal Status: Fetal Heart Rate (bpm): NST Fundal Height: 41 cm Movement: Present  Presentation: Vertex  General:  Alert, oriented and cooperative. Patient is in no acute distress.  Skin: Skin is warm and dry. No rash noted.   Cardiovascular: Normal heart rate noted  Respiratory: Normal respiratory effort, no problems with respiration noted  Abdomen: Soft, gravid, appropriate for gestational age. Pain/Pressure: Present     Pelvic: Vag. Bleeding: None Vag D/C Character: Thin   Cervical exam performed Dilation: 3.5 Effacement (%): 50 Station: -2  Extremities: Normal range of motion.  Edema: Mild pitting, slight indentation  Mental Status: Normal mood and affect. Normal behavior. Normal judgment and thought content.   Urinalysis: Urine Protein: Trace Urine Glucose: Negative  Assessment and Plan:   Pregnancy: [id number] at 5371w6d  1. Supervision of high risk pregnancy, antepartum, third trimester - Discussed ultrasound results > LGA  2. Diabetes mellitus in pregnancy, antepartum, third trimester - IOL at 39 wks  3. LGA (large for gestational age) fetus affecting mother, antepartum, third trimester, not applicable or unspecified fetus - Discussed ultrasound results > LGA  Term labor symptoms and general obstetric precautions including but not limited to vaginal bleeding, contractions, leaking of fluid and fetal movement were reviewed in detail with the patient. Please refer to After Visit Summary for other counseling recommendations.  Return for NST 2/wk and appt weekly.   Eino FarberWalidah Kennith GainN Karim, CNM

## 2015-06-01 ENCOUNTER — Encounter: Payer: Self-pay | Admitting: *Deleted

## 2015-06-02 ENCOUNTER — Ambulatory Visit (INDEPENDENT_AMBULATORY_CARE_PROVIDER_SITE_OTHER): Payer: BLUE CROSS/BLUE SHIELD | Admitting: *Deleted

## 2015-06-02 VITALS — BP 129/78 | HR 79 | Wt 260.0 lb

## 2015-06-02 DIAGNOSIS — O24419 Gestational diabetes mellitus in pregnancy, unspecified control: Secondary | ICD-10-CM

## 2015-06-02 DIAGNOSIS — O24415 Gestational diabetes mellitus in pregnancy, controlled by oral hypoglycemic drugs: Secondary | ICD-10-CM | POA: Diagnosis not present

## 2015-06-02 NOTE — Progress Notes (Signed)
Pt states that she has been having strong contractions but irregular. Pt had no contractions while on NST.   She states that she feels like she may have been leaking fluid since the weekend.  Amnioswab is neg as well as the fern slide.  Cervical check is 4.0cm and thin.  Lots of cervical mucus noted.  Pt will rtn on Friday or prn.

## 2015-06-03 ENCOUNTER — Encounter (HOSPITAL_COMMUNITY): Payer: Self-pay | Admitting: *Deleted

## 2015-06-03 ENCOUNTER — Inpatient Hospital Stay (HOSPITAL_COMMUNITY)
Admission: AD | Admit: 2015-06-03 | Discharge: 2015-06-05 | DRG: 775 | Disposition: A | Discharge status: Home / Self Care | Payer: BLUE CROSS/BLUE SHIELD | Source: Ambulatory Visit | Attending: Obstetrics and Gynecology | Admitting: Obstetrics and Gynecology

## 2015-06-03 ENCOUNTER — Ambulatory Visit (INDEPENDENT_AMBULATORY_CARE_PROVIDER_SITE_OTHER): Payer: BLUE CROSS/BLUE SHIELD | Admitting: Obstetrics & Gynecology

## 2015-06-03 ENCOUNTER — Other Ambulatory Visit: Payer: BLUE CROSS/BLUE SHIELD

## 2015-06-03 ENCOUNTER — Encounter: Payer: Self-pay | Admitting: Obstetrics & Gynecology

## 2015-06-03 VITALS — BP 132/77 | HR 63 | Wt 260.0 lb

## 2015-06-03 DIAGNOSIS — Z9049 Acquired absence of other specified parts of digestive tract: Secondary | ICD-10-CM

## 2015-06-03 DIAGNOSIS — Z3A38 38 weeks gestation of pregnancy: Secondary | ICD-10-CM

## 2015-06-03 DIAGNOSIS — O3663X0 Maternal care for excessive fetal growth, third trimester, not applicable or unspecified: Secondary | ICD-10-CM | POA: Diagnosis present

## 2015-06-03 DIAGNOSIS — O4292 Full-term premature rupture of membranes, unspecified as to length of time between rupture and onset of labor: Secondary | ICD-10-CM | POA: Diagnosis present

## 2015-06-03 DIAGNOSIS — O24425 Gestational diabetes mellitus in childbirth, controlled by oral hypoglycemic drugs: Secondary | ICD-10-CM | POA: Diagnosis present

## 2015-06-03 DIAGNOSIS — Z833 Family history of diabetes mellitus: Secondary | ICD-10-CM

## 2015-06-03 DIAGNOSIS — Z8711 Personal history of peptic ulcer disease: Secondary | ICD-10-CM | POA: Diagnosis not present

## 2015-06-03 DIAGNOSIS — O99354 Diseases of the nervous system complicating childbirth: Secondary | ICD-10-CM | POA: Diagnosis present

## 2015-06-03 DIAGNOSIS — Z806 Family history of leukemia: Secondary | ICD-10-CM

## 2015-06-03 DIAGNOSIS — Z6841 Body Mass Index (BMI) 40.0 and over, adult: Secondary | ICD-10-CM | POA: Diagnosis not present

## 2015-06-03 DIAGNOSIS — E559 Vitamin D deficiency, unspecified: Secondary | ICD-10-CM | POA: Diagnosis present

## 2015-06-03 DIAGNOSIS — O24419 Gestational diabetes mellitus in pregnancy, unspecified control: Secondary | ICD-10-CM

## 2015-06-03 DIAGNOSIS — Z8249 Family history of ischemic heart disease and other diseases of the circulatory system: Secondary | ICD-10-CM

## 2015-06-03 DIAGNOSIS — O0993 Supervision of high risk pregnancy, unspecified, third trimester: Secondary | ICD-10-CM

## 2015-06-03 DIAGNOSIS — O99214 Obesity complicating childbirth: Secondary | ICD-10-CM | POA: Diagnosis present

## 2015-06-03 DIAGNOSIS — E669 Obesity, unspecified: Secondary | ICD-10-CM | POA: Diagnosis present

## 2015-06-03 DIAGNOSIS — Z3490 Encounter for supervision of normal pregnancy, unspecified, unspecified trimester: Secondary | ICD-10-CM

## 2015-06-03 DIAGNOSIS — O09523 Supervision of elderly multigravida, third trimester: Secondary | ICD-10-CM

## 2015-06-03 LAB — CBC
HCT: 37.7 % (ref 36.0–46.0)
HEMOGLOBIN: 12.7 g/dL (ref 12.0–15.0)
MCH: 30.4 pg (ref 26.0–34.0)
MCHC: 33.7 g/dL (ref 30.0–36.0)
MCV: 90.2 fL (ref 78.0–100.0)
PLATELETS: 191 10*3/uL (ref 150–400)
RBC: 4.18 MIL/uL (ref 3.87–5.11)
RDW: 14.1 % (ref 11.5–15.5)
WBC: 13.8 10*3/uL — ABNORMAL HIGH (ref 4.0–10.5)

## 2015-06-03 LAB — TYPE AND SCREEN
ABO/RH(D): O POS
Antibody Screen: NEGATIVE

## 2015-06-03 LAB — GLUCOSE, CAPILLARY: GLUCOSE-CAPILLARY: 94 mg/dL (ref 65–99)

## 2015-06-03 MED ORDER — ONDANSETRON HCL 4 MG/2ML IJ SOLN: 4.0000 mg | INTRAMUSCULAR | Status: DC | PRN

## 2015-06-03 MED ORDER — ONDANSETRON HCL 4 MG/2ML IJ SOLN: 4.0000 mg | Freq: Four times a day (QID) | INTRAMUSCULAR | Status: DC | PRN

## 2015-06-03 MED ORDER — LACTATED RINGERS IV SOLN: 500.0000 mL | INTRAVENOUS | Status: DC | PRN

## 2015-06-03 MED ORDER — OXYCODONE-ACETAMINOPHEN 5-325 MG PO TABS: 2.0000 | ORAL_TABLET | ORAL | Status: DC | PRN

## 2015-06-03 MED ORDER — LIDOCAINE HCL (PF) 1 % IJ SOLN
30.0000 mL | INTRAMUSCULAR | Status: DC | PRN
  Filled 2015-06-03: qty 30

## 2015-06-03 MED ORDER — CITRIC ACID-SODIUM CITRATE 334-500 MG/5ML PO SOLN: 30.0000 mL | ORAL | Status: DC | PRN

## 2015-06-03 MED ORDER — TETANUS-DIPHTH-ACELL PERTUSSIS 5-2.5-18.5 LF-MCG/0.5 IM SUSP: 0.5000 mL | Freq: Once | INTRAMUSCULAR | Status: DC

## 2015-06-03 MED ORDER — MEASLES, MUMPS & RUBELLA VAC ~~LOC~~ INJ
0.5000 mL | INJECTION | Freq: Once | SUBCUTANEOUS | Status: DC
  Filled 2015-06-03: qty 0.5

## 2015-06-03 MED ORDER — OXYCODONE-ACETAMINOPHEN 5-325 MG PO TABS: 1.0000 | ORAL_TABLET | ORAL | Status: DC | PRN

## 2015-06-03 MED ORDER — PRENATAL MULTIVITAMIN CH
1.0000 | ORAL_TABLET | Freq: Every day | ORAL | Status: DC
  Administered 2015-06-04: 1 via ORAL
  Filled 2015-06-03: qty 1

## 2015-06-03 MED ORDER — DIPHENHYDRAMINE HCL 25 MG PO CAPS: 25.0000 mg | ORAL_CAPSULE | Freq: Four times a day (QID) | ORAL | Status: DC | PRN

## 2015-06-03 MED ORDER — LACTATED RINGERS IV SOLN: INTRAVENOUS | Status: DC

## 2015-06-03 MED ORDER — ACETAMINOPHEN 325 MG PO TABS
650.0000 mg | ORAL_TABLET | ORAL | Status: DC | PRN
  Filled 2015-06-03: qty 2

## 2015-06-03 MED ORDER — OXYTOCIN BOLUS FROM INFUSION
500.0000 mL | INTRAVENOUS | Status: DC
  Administered 2015-06-03: 500 mL via INTRAVENOUS

## 2015-06-03 MED ORDER — SENNOSIDES-DOCUSATE SODIUM 8.6-50 MG PO TABS
2.0000 | ORAL_TABLET | ORAL | Status: DC
  Administered 2015-06-04 – 2015-06-05 (×2): 2 via ORAL
  Filled 2015-06-03 (×2): qty 2

## 2015-06-03 MED ORDER — WITCH HAZEL-GLYCERIN EX PADS: 1.0000 "application " | MEDICATED_PAD | CUTANEOUS | Status: DC | PRN

## 2015-06-03 MED ORDER — LANOLIN HYDROUS EX OINT: TOPICAL_OINTMENT | CUTANEOUS | Status: DC | PRN

## 2015-06-03 MED ORDER — ONDANSETRON HCL 4 MG PO TABS: 4.0000 mg | ORAL_TABLET | ORAL | Status: DC | PRN

## 2015-06-03 MED ORDER — OXYTOCIN 10 UNIT/ML IJ SOLN
2.5000 [IU]/h | INTRAMUSCULAR | Status: DC
  Filled 2015-06-03: qty 10

## 2015-06-03 MED ORDER — DIBUCAINE 1 % RE OINT: 1.0000 "application " | TOPICAL_OINTMENT | RECTAL | Status: DC | PRN

## 2015-06-03 MED ORDER — ACETAMINOPHEN 325 MG PO TABS
650.0000 mg | ORAL_TABLET | ORAL | Status: DC | PRN
  Administered 2015-06-04: 650 mg via ORAL

## 2015-06-03 MED ORDER — IBUPROFEN 600 MG PO TABS
600.0000 mg | ORAL_TABLET | Freq: Four times a day (QID) | ORAL | Status: DC
  Administered 2015-06-03 – 2015-06-05 (×7): 600 mg via ORAL
  Filled 2015-06-03 (×7): qty 1

## 2015-06-03 MED ORDER — SIMETHICONE 80 MG PO CHEW: 80.0000 mg | CHEWABLE_TABLET | ORAL | Status: DC | PRN

## 2015-06-03 MED ORDER — FLEET ENEMA 7-19 GM/118ML RE ENEM: 1.0000 | ENEMA | RECTAL | Status: DC | PRN

## 2015-06-03 MED ORDER — ZOLPIDEM TARTRATE 5 MG PO TABS: 5.0000 mg | ORAL_TABLET | Freq: Every evening | ORAL | Status: DC | PRN

## 2015-06-03 MED ORDER — BENZOCAINE-MENTHOL 20-0.5 % EX AERO
1.0000 "application " | INHALATION_SPRAY | CUTANEOUS | Status: DC | PRN
  Administered 2015-06-03: 1 via TOPICAL
  Filled 2015-06-03: qty 56

## 2015-06-03 NOTE — Progress Notes (Signed)
Ambulated pt to BR and assessed lochia as heavy bleeding. Pt emptied her bladder, peri care performed, new pads applied and assisited back to bed. Fundal massage performed,fundus firm but 2/u and lochia WNL. Saturated pads weighed for total of 314gms or ccs. Making total EBL 464ccs. Report given to Night RN, Sherron AlesSydney. More frequent lochia assessments recommended. Pt instructed to call for any change in condition.

## 2015-06-03 NOTE — H&P (Signed)
LABOR AND DELIVERY ADMISSION HISTORY AND PHYSICAL NOTE  Brittney Moore is a 35 y.o. female w/ past obstetric hx of shoulder distocia, GDM A2, AMA, multigravida, LGA, and post partum depression.  She presents at [id number] with IUP at 75w4dby 10 wk UKoreapresenting for labor secondary to SROM.   She reports positive fetal movement.  Around 1530 the patient started to have clear fluid leak and bleeding.    3/2 UKoreashows EFW of 3580g, >90%. AFI of 18.94.    Prenatal History/Complications: high-risk pregnancy and has Prior pregnancy with 24 week twin fetal demise, antepartum; Sleep apnea; History of shoulder dystocia in prior pregnancy; Obesity; Post partum depression; History of preterm delivery; GDM, class A2 on glyburide and metformin; Supervision of high risk pregnancy, antepartum; Diabetes mellitus in pregnancy, antepartum; AMA (advanced maternal age) multigravida 35+; and LGA (large for gestational age) fetus affecting mother, antepartum on her problem list.  Past Medical History: Past Medical History  Diagnosis Date  . History of stomach ulcers   . Vitamin D deficiency   . Anemia   . Gestational diabetes mellitus, antepartum   . Gestational diabetes     Past Surgical History: Past Surgical History  Procedure Laterality Date  . Cholecystectomy    . Tonsillectomy and adenoidectomy    . Wisdom tooth extraction    . Bartholin gland cyst excision      Obstetrical History: OB History    Gravida Para Term Preterm AB TAB SAB Ectopic Multiple Living   7 4 3 1 2  2  1 3       Social History: Social History   Social History  . Marital Status: Married    Spouse Name: N/A  . Number of Children: N/A  . Years of Education: N/A   Occupational History  . sales assoc    Social History Main Topics  . Smoking status: Never Smoker   . Smokeless tobacco: Never Used  . Alcohol Use: No  . Drug Use: No  . Sexual Activity:    Partners: Male    Birth Control/ Protection: None   Other  Topics Concern  . Not on file   Social History Narrative    Family History: Family History  Problem Relation Age of Onset  . Congestive Heart Failure Mother   . Leukemia Father   . Heart disease Father   . Diabetes Father   . Cancer Maternal Grandfather   . Mental illness Paternal Grandmother   . Cancer Paternal Grandfather     Allergies: Allergies  Allergen Reactions  . Other Itching    Paprika  . Keflex [Cephalexin] Rash    Prescriptions prior to admission  Medication Sig Dispense Refill Last Dose  . glyBURIDE (DIABETA) 2.5 MG tablet Take 1 tablet (2.5 mg total) by mouth at bedtime. 30 tablet 3 06/02/2015 at Unknown time  . metFORMIN (GLUCOPHAGE-XR) 500 MG 24 hr tablet TAKE 1 TABLET IN THE AM WITH BREAKFAST AND 2 TABLETS AT BEDTIME. (Patient taking differently: Take 2 tabs at bedtime) 90 tablet 1 06/02/2015 at Unknown time  . Prenat w/o A-FeCbGl-DSS-FA-DHA (CITRANATAL 90 DHA) 90-1 & 300 MG MISC Take 1 tablet by mouth daily. 30 each 12 06/02/2015 at Unknown time  . ACCU-CHEK FASTCLIX LANCETS MISC 1 each by Does not apply route.   Unknown at Unknown time  . Blood Glucose Monitoring Suppl (ACCU-CHEK AVIVA PLUS) W/DEVICE KIT 1 each by Does not apply route.   Unknown at Unknown time  . glucose blood (ACCU-CHEK AVIVA)  test strip 1 each by Other route 4 (four) times daily. 100 each 2 Unknown at Unknown time  . pseudoephedrine (SUDAFED) 30 MG tablet Take 30 mg by mouth every 6 (six) hours as needed for congestion. Reported on 06/03/2015   Not Taking at Unknown time     Review of Systems   All systems reviewed and negative except as stated in HPI  Blood pressure 146/89, pulse 68, temperature 98.3 F (36.8 C), temperature source Oral, resp. rate 18, last menstrual period 08/31/2014, currently breastfeeding. General appearance: alert and cooperative Lungs: clear to auscultation bilaterally Heart: regular rate and rhythm Abdomen: soft, non-tender; bowel sounds normal Extremities: No  calf swelling or tenderness Presentation: cephalic Fetal monitoring: Category 1 tracing Uterine activity: Contractions Dilation: 5 Effacement (%): 100 Station: 0 Exam by:: Wess Botts RN   Prenatal labs: ABO, Rh: --/--/O POS (03/15 1710) Antibody: PENDING (03/15 1710) Rubella: !Error! RPR: NON REAC (01/06 0914)  HBsAg: Negative (08/16 0000)  HIV: NONREACTIVE (01/06 0914)  GBS:   GBS negative on 05/15/15. 3 hour GTT: A2. Genetic screening:  NIPS: low risk female. Anatomy US: wnl  Prenatal Transfer Tool  Maternal Diabetes: Yes:  Diabetes Type:  Insulin/Medication controlled Genetic Screening: Normal Maternal Ultrasounds/Referrals: Normal Fetal Ultrasounds or other Referrals:  None Maternal Substance Abuse:  No Significant Maternal Medications:  Meds include: Other:   Glyburide 2.5 mg, Metformin 1075m.  Significant Maternal Lab Results: Lab values include: Group B Strep negative  Results for orders placed or performed during the hospital encounter of 06/03/15 (from the past 24 hour(s))  CBC   Collection Time: 06/03/15  5:10 PM  Result Value Ref Range   WBC 13.8 (H) 4.0 - 10.5 K/uL   RBC 4.18 3.87 - 5.11 MIL/uL   Hemoglobin 12.7 12.0 - 15.0 g/dL   HCT 37.7 36.0 - 46.0 %   MCV 90.2 78.0 - 100.0 fL   MCH 30.4 26.0 - 34.0 pg   MCHC 33.7 30.0 - 36.0 g/dL   RDW 14.1 11.5 - 15.5 %   Platelets 191 150 - 400 K/uL  Type and screen WBaxter  Collection Time: 06/03/15  5:10 PM  Result Value Ref Range   ABO/RH(D) O POS    Antibody Screen PENDING    Sample Expiration 06/06/2015     Patient Active Problem List   Diagnosis Date Noted  . Term pregnancy 06/03/2015  . LGA (large for gestational age) fetus affecting mother, antepartum 05/24/2015  . Supervision of high risk pregnancy, antepartum 02/26/2015  . Diabetes mellitus in pregnancy, antepartum 02/26/2015  . AMA (advanced maternal age) multigravida 35+ 02/26/2015  . GDM, class A2 06/05/2014  .  History of shoulder dystocia in prior pregnancy 06/03/2014  . Obesity 06/03/2014  . Post partum depression 06/03/2014  . History of preterm delivery 06/03/2014  . Sleep apnea 11/28/2013  . Prior pregnancy with 24 week twin fetal demise, antepartum 09/16/2013    Assessment: Brittney Nesseris a 35y.o. [id number] 328w4dere for term labor 2/2 SROM.  #Labor:Expectant management. #Pain: Desires natural birth. #gDiabetes A2:  CBGQ4hr.  If elevated above 126 will consider insulin infusion. #FWB: Category 1 tracing. #ID:  GBS negative.  ROM at 1530 on 3/15.  Will monitor for need for abx at 18hrs.  #MOF: Breast #MOC:IUD #Circ:  Female  MiChristian MaterBolivia/15/2017, 5:46 PM    OB FELLOW HISTORY AND PHYSICAL ATTESTATION  I have seen and examined this patient; I agree with above documentation  in the resident's note. Hx shoulder dystocia, is a2gdm with suspected macrosomia, will make sure to have adequate personnel in room at time of delivery.   Brittney Moore 06/03/2015, 7:45 PM

## 2015-06-03 NOTE — MAU Note (Signed)
Water broke out 1530, clear, lots of blood. Was 5 cm this morning.   Ctx's are 3-4 min apart.  Has gest diab

## 2015-06-03 NOTE — Progress Notes (Signed)
Subjective:  Brittney Moore is a 35 y.o. [id number] at 5450w4d being seen today for ongoing prenatal care.  She is currently monitored for the following issues for this high-risk pregnancy and has Prior pregnancy with 24 week twin fetal demise, antepartum; Sleep apnea; History of shoulder dystocia in prior pregnancy; Obesity; Post partum depression; History of preterm delivery; GDM, class A2; Supervision of high risk pregnancy, antepartum; Diabetes mellitus in pregnancy, antepartum; AMA (advanced maternal age) multigravida 35+; and LGA (large for gestational age) fetus affecting mother, antepartum on her problem list.  Patient reports no complaints.  Contractions: Irregular. Vag. Bleeding: Small.  Movement: Present. Denies leaking of fluid.   The following portions of the patient's history were reviewed and updated as appropriate: allergies, current medications, past family history, past medical history, past social history, past surgical history and problem list. Problem list updated.  Objective:   Filed Vitals:   06/03/15 0843  BP: 132/77  Pulse: 63  Weight: 260 lb (117.935 kg)    Fetal Status: Fetal Heart Rate (bpm): 140 Fundal Height: 40 cm Movement: Present  Presentation: Vertex  General:  Alert, oriented and cooperative. Patient is in no acute distress.  Skin: Skin is warm and dry. No rash noted.   Cardiovascular: Normal heart rate noted  Respiratory: Normal respiratory effort, no problems with respiration noted  Abdomen: Soft, gravid, appropriate for gestational age. Pain/Pressure: Present     Pelvic: Vag. Bleeding: Small Vag D/C Character: Mucous   Cervical exam performed Dilation: 5 Effacement (%): 50 Station: -2  Extremities: Normal range of motion.  Edema: Mild pitting, slight indentation  Mental Status: Normal mood and affect. Normal behavior. Normal judgment and thought content.   Urinalysis: Urine Protein: Trace Urine Glucose: Negative  Assessment and Plan:  Pregnancy:  [id number] at 6550w4d  1. AMA (advanced maternal age) multigravida 35+, third trimester   2. Supervision of high risk pregnancy, antepartum, third trimester   3. LGA (large for gestational age) fetus affecting mother, antepartum, third trimester, not applicable or unspecified fetus   4. GDM, class A2 - IOL set up for 39 weeks  Term labor symptoms and general obstetric precautions including but not limited to vaginal bleeding, contractions, leaking of fluid and fetal movement were reviewed in detail with the patient. Please refer to After Visit Summary for other counseling recommendations.  Return in about 6 weeks (around 07/15/2015).   Allie BossierMyra C Kiano Terrien, MD

## 2015-06-04 LAB — GLUCOSE, CAPILLARY: Glucose-Capillary: 84 mg/dL (ref 65–99)

## 2015-06-04 LAB — RPR: RPR Ser Ql: NONREACTIVE

## 2015-06-04 MED ORDER — METHYLERGONOVINE MALEATE 0.2 MG PO TABS
0.2000 mg | ORAL_TABLET | ORAL | Status: AC
  Administered 2015-06-04 – 2015-06-05 (×6): 0.2 mg via ORAL
  Filled 2015-06-04 (×6): qty 1

## 2015-06-04 MED ORDER — METHYLERGONOVINE MALEATE 0.2 MG/ML IJ SOLN
0.2000 mg | INTRAMUSCULAR | Status: AC
  Filled 2015-06-04 (×3): qty 1

## 2015-06-04 NOTE — Progress Notes (Signed)
POSTPARTUM PROGRESS NOTE  Post Partum Day 1 Subjective:  Brittney Moore is a 35 y.o. [id number] 7753w4d s/p SVD at 801930. Pregnancy complicated by A2 GD, controlled with glyburide and metformin. Pt passed a large blood clot, but has since had minimal vaginal bleeding.  Pt denies problems with ambulating, voiding or po intake.  She denies nausea or vomiting.  Pain is well controlled.  She has not had flatus. She has not had bowel movement.  Lochia Moderate.   Objective: Blood pressure 111/74, pulse 65, temperature 98.9 F (37.2 C), temperature source Oral, resp. rate 18, height 5\' 6"  (1.676 m), weight 117.935 kg (260 lb), last menstrual period 08/31/2014, SpO2 96 %, unknown if currently breastfeeding.  Physical Exam:  General: alert, cooperative and no distress Abdomen: soft, nontender,  Uterine Fundus: firm, 2 cm below umbilicus DVT Evaluation: No calf swelling or tenderness Extremities: trace edema   Recent Labs  06/03/15 1710  HGB 12.7  HCT 37.7    Assessment/Plan:  ASSESSMENT: Brittney ClockCharmaine Mathers is a 35 y.o. [id number] 153w4d w/ A2GDM s/p SVD at 371930. Pt appears comfortable and in no acute distress. Plan for discharge tomorrow.  Contraception  IUD  GDM: Monitor fasting glucose in patient, plan for a 1-hour GTT in office at 6-week follow up visit.  Contraception: Patient requests and IUD   LOS: 1 day   Lucretia KernJohn Diehl 06/04/2015, 6:59 AM   CNM attestation Post Partum Day #1  Brittney Moore is a 35 y.o. [id number] s/p SVD.  Pt denies problems with ambulating, voiding or po intake. Pain is well controlled.  Plan for birth control is IUD.  Method of Feeding: breast  PE:  BP 111/73 mmHg  Pulse 79  Temp(Src) 98 F (36.7 C) (Oral)  Resp 18  Ht 5\' 6"  (1.676 m)  Wt 117.935 kg (260 lb)  BMI 41.99 kg/m2  SpO2 97%  LMP 08/31/2014 (Exact Date)  Breastfeeding? Unknown Fundus firm  Plan for discharge: possibly later this evening if infant is able to be discharged; if not, plan on  06/05/15.  Cam HaiSHAW, Darneshia Demary, CNM 11:45 PM

## 2015-06-04 NOTE — Lactation Note (Signed)
This note was copied from a baby's chart. Lactation Consultation Note  Patient Name: Brittney Moore UJWJX'BToday's Date: 06/04/2015 Reason for consult: Initial assessment  Baby 19 hours old. Mom reports that her left nipple is red and sore. Mom states that she prefers to use football positioning, so assisted mom with positioning her pillows in order to have baby nose-to-nipple. Mom able to latch baby deeply, and baby suckled rhythmically with a few swallows noted. Demonstrated how to tug baby's chin and flange lower lip and mom reported increased comfort. Enc mom to keep nursing with cues. Mom had questions about nursing/pumping and EBM supply when returning to work. Mom reports that her job is stressful and she thinks this is what causes her EBM supply to decrease when she returns to work. Discussed ways of reducing stress and increasing comfort and flow.   [facility], aware of OP/BFSF and LC phone line assistance after D/C.   Maternal Data Has patient been taught Hand Expression?: Yes Does the patient have breastfeeding experience prior to this delivery?: Yes  Feeding Feeding Type: Breast Fed Length of feed:  (LC assessed first 15 minutes of BF.)  LATCH Score/Interventions Latch: Grasps breast easily, tongue down, lips flanged, rhythmical sucking.  Audible Swallowing: A few with stimulation Intervention(s): Skin to skin;Hand expression  Type of Nipple: Everted at rest and after stimulation (short shaft)  Comfort (Breast/Nipple): Filling, red/small blisters or bruises, mild/mod discomfort  Problem noted: Mild/Moderate discomfort  Hold (Positioning): No assistance needed to correctly position infant at breast.  LATCH Score: 8  Lactation Tools Discussed/Used     Consult Status Consult Status: Follow-up Date: 06/05/15 Follow-up type: In-patient    Geralynn OchsWILLIARD, Vian Fluegel 06/04/2015, 3:24 PM

## 2015-06-05 ENCOUNTER — Encounter: Payer: BLUE CROSS/BLUE SHIELD | Admitting: Family

## 2015-06-05 MED ORDER — ACETAMINOPHEN 325 MG PO TABS: 650.0000 mg | ORAL_TABLET | ORAL | Status: DC | PRN

## 2015-06-05 NOTE — Lactation Note (Addendum)
This note was copied from a baby's chart. Lactation Consultation Note Follow up visit at 40 hours of age.  Mom is ready for discharge and reports good feedings.  Mom wanting to feed baby before discharge, and discussed waking techniques.  Mom declines need for assist as this time.  Mom has experience breastfeeding older children and denies concerns or questions at this time.  Discussed milk transitioning to larger volume, engorgement care discussed.  Mom to soften breast as needed prior to latch. Mom aware of out patient services for lactation assist.     Patient Name: Brittney Moore ClockCharmaine Sadowski ZOXWR'UToday's Date: 06/05/2015 Reason for consult: Follow-up assessment   Maternal Data    Feeding Feeding Type: Breast Fed Length of feed: 10 min  LATCH Score/Interventions                      Lactation Tools Discussed/Used     Consult Status Consult Status: Complete    Maury Groninger, Arvella MerlesJana Lynn 06/05/2015, 12:23 PM

## 2015-06-05 NOTE — Discharge Summary (Signed)
OB Discharge Summary     Patient Name: Brittney Moore DOB: 1980/11/22 MRN: [id number]  Date of admission: 06/03/2015 Delivering MD: Laurey Arrow BEDFORD   Date of discharge: 06/05/2015  Admitting diagnosis: 40w giving birth Intrauterine pregnancy: [redacted]w[redacted]d    Secondary diagnosis:  Active Problems:   Term pregnancy  Additional problems:  Patient Active Problem List   Diagnosis Date Noted  . Term pregnancy 06/03/2015  . LGA (large for gestational age) fetus affecting mother, antepartum 05/24/2015  . Supervision of high risk pregnancy, antepartum 02/26/2015  . Diabetes mellitus in pregnancy, antepartum 02/26/2015  . AMA (advanced maternal age) multigravida 35+ 02/26/2015  . GDM, class A2 06/05/2014  . History of shoulder dystocia in prior pregnancy 06/03/2014  . Obesity 06/03/2014  . Post partum depression 06/03/2014  . History of preterm delivery 06/03/2014  . Sleep apnea 11/28/2013  . Prior pregnancy with 24 week twin fetal demise, antepartum 09/16/2013     Discharge diagnosis: Term Pregnancy Delivered and GDM A2                                                                                                Post partum procedures:none  Augmentation: None  Complications: None  Hospital course:  Onset of Labor With Vaginal Delivery     35y.o. yo [id number] 36w4das admitted in Active Labor on 06/03/2015. Patient had an uncomplicated labor course as follows:  Membrane Rupture Time/Date: 7:25 PM ,06/03/2015   Intrapartum Procedures: Episiotomy: None [1]                                         Lacerations:  None [1]  Patient had a delivery of a Viable infant. 06/03/2015  Information for the patient's newborn:  WhPriscilla, Kirstein0[[id number]]Delivery Method: Vaginal, Spontaneous Delivery (Filed from Delivery Summary)   Pateint had an uncomplicated postpartum course.  She was seen by CSW for hx of post partum depression. CSW stated no further intervention required and  no barriers to discharge. She is ambulating, tolerating a regular diet, passing flatus, and urinating well. Patient is discharged home in stable condition on 06/05/2015.    Physical exam  Filed Vitals:   06/04/15 0745 06/04/15 1900 06/04/15 2001 06/05/15 0555  BP: 112/70 88/52 137/78 111/73  Pulse: 69 72  79  Temp: 98 F (36.7 C) 98.2 F (36.8 C)  98 F (36.7 C)  TempSrc: Oral Oral  Oral  Resp: _0 Height:      Weight:      SpO2:  97%     General: alert, cooperative and no distress Lochia: appropriate Uterine Fundus: firm Incision: Healing well with no significant drainage, No significant erythema, Dressing is clean, dry, and intact DVT Evaluation: No evidence of DVT seen on physical exam. Negative Homan's sign. No cords or calf tenderness. No significant calf/ankle edema. Labs: Lab Results  Component Value Date   WBC 13.8* 06/03/2015   HGB 12.7 06/03/2015   HCT 37.7 06/03/2015  MCV 90.2 06/03/2015   PLT 191 06/03/2015   CMP Latest Ref Rng 12/26/2013  Glucose 70 - 99 mg/dL 86  BUN 6 - 23 mg/dL 4(L)  Creatinine 0.50 - 1.10 mg/dL 0.47(L)  Sodium 137 - 147 mEq/L 136(L)  Potassium 3.7 - 5.3 mEq/L 4.2  Chloride 96 - 112 mEq/L 103  CO2 19 - 32 mEq/L 22  Calcium 8.4 - 10.5 mg/dL 8.6  Total Protein 6.0 - 8.3 g/dL 6.4  Total Bilirubin 0.3 - 1.2 mg/dL <0.2(L)  Alkaline Phos 39 - 117 U/L 61  AST 0 - 37 U/L 16  ALT 0 - 35 U/L 16    Discharge instruction: per After Visit Summary and "Baby and Me Booklet".  After visit meds:    Medication List    STOP taking these medications        ACCU-CHEK AVIVA PLUS w/Device Kit     ACCU-CHEK FASTCLIX LANCETS Misc     glucose blood test strip  Commonly known as:  ACCU-CHEK AVIVA     glyBURIDE 2.5 MG tablet  Commonly known as:  DIABETA     metFORMIN 500 MG 24 hr tablet  Commonly known as:  GLUCOPHAGE-XR     pseudoephedrine 30 MG tablet  Commonly known as:  SUDAFED      TAKE these medications         acetaminophen 325 MG tablet  Commonly known as:  TYLENOL  Take 2 tablets (650 mg total) by mouth every 4 (four) hours as needed.     CITRANATAL 90 DHA 90-1 & 300 MG Misc  Take 1 tablet by mouth daily.        Diet: routine diet  Activity: Advance as tolerated. Pelvic rest for 6 weeks.   Outpatient follow up:6 weeks for SVD follow up and 1 week follow up to check for post partum depression. Follow up Appt:No future appointments. Follow up Visit:No Follow-up on file.  Postpartum contraception: IUD  Newborn Data: Live born female  Birth Weight: 7 lb 9.3 oz (3440 g) APGAR: 9, 9  Baby Feeding: Breast Disposition:home with mother   06/05/2015 Carlyle Dolly, MD  OB FELLOW DISCHARGE ATTESTATION  I have seen and examined this patient and agree with above documentation in the resident's note.   Desma Maxim, MD 10:37 AM

## 2015-06-05 NOTE — Clinical Social Work Maternal (Signed)
  CLINICAL SOCIAL WORK MATERNAL/CHILD NOTE  Patient Details  Name: Brittney Moore MRN: [id number] Date of Birth: 12/21/1980  Date:  06/05/2015  Clinical Social Worker Initiating Note:  Loleta BooksSarah Tya Haughey MSW, LCSW Date/ Time Initiated:  06/05/15/0940    Child's Name:  TurkeyVictoria   Legal Guardian:  Samanthamarie & Rico Junkerichard Arther   Need for Interpreter:  None   Date of Referral:  06/03/15     Reason for Referral:  History of postpartum depression  [facility]   Address:  [address] [place], [place] [address]  Phone number:  [phone]   Household Members:  Minor Children, Spouse   Natural Supports (not living in the home):  Immediate Family, Extended Family   Professional Supports: None   Employment: Full-time   Education:  IT sales professionalCollege graduate   Financial Resources:  Media plannerrivate Insurance   Other Resources:    None identified  Cultural/Religious Considerations Which May Impact Care:  None reported  Strengths:  Ability to meet basic needs , Pediatrician chosen , Home prepared for child    Risk Factors/Current Problems:  None   Cognitive State:  Able to Concentrate , Alert , Goal Oriented , Linear Thinking    Mood/Affect:  Bright , Happy , Comfortable , Calm    CSW Assessment:  CSW received request for consult due to MOB presenting with a history of postpartum depression.  MOB and FOB presented as easily engaged and receptive to the visit. MOB and FOB were preparing for discharge, and reported feeling happy and excited.  Per MOB, they have a 7514 month old daughter, and two teenagers (ages 4614 and 3215).  MOB stated that they are looking forward to their reunion at home, but did acknowledge feeling anxious about how her youngest child will react and respond.  She discussed belief that it will be "okay" since she has the help and support, and plans to assist her youngest transition.   MOB denied belief that she experienced postpartum depression.  She  stated that it was likely noted in her chart, since she became emotional and tearful during her postpartum visit after her last child was born. MOB reported that it was situational due to returning to work, and changes in her amount of FMLA.   MOB denied common symptoms of PPD, and denied belief that any symptoms of the blues that she experienced led to negative and/or unwanted behaviors.  MOB denied mental health complications during the pregnancy.  MOB and FOB presented as attentive and engaged as CSW provided education on the baby blues and perinatal mood disorders. MOB and FOB expressed appreciation for the visit, and agreed to follow up with medical providers if she notes onset of symptoms.  MOB and FOB expressed appreciation for the visit, and agreed to contact CSW if additional needs arise.  CSW Plan/Description:   1. Patient/Family Education -- perinatal mood and anxiety disorders 2. No Further Intervention Required/No Barriers to Discharge    Kelby FamVenning, Shantoria Ellwood N, LCSW 06/05/2015, 11:07 AM

## 2015-06-06 ENCOUNTER — Inpatient Hospital Stay (HOSPITAL_COMMUNITY): Admission: RE | Admit: 2015-06-06 | Payer: BLUE CROSS/BLUE SHIELD | Source: Ambulatory Visit

## 2015-06-13 ENCOUNTER — Inpatient Hospital Stay (HOSPITAL_COMMUNITY): Payer: BLUE CROSS/BLUE SHIELD

## 2015-07-13 ENCOUNTER — Ambulatory Visit (INDEPENDENT_AMBULATORY_CARE_PROVIDER_SITE_OTHER): Payer: BLUE CROSS/BLUE SHIELD | Admitting: Advanced Practice Midwife

## 2015-07-13 ENCOUNTER — Encounter: Payer: Self-pay | Admitting: *Deleted

## 2015-07-13 ENCOUNTER — Encounter: Payer: Self-pay | Admitting: Advanced Practice Midwife

## 2015-07-13 VITALS — BP 119/83 | HR 91 | Resp 16 | Ht 66.0 in | Wt 239.0 lb

## 2015-07-13 DIAGNOSIS — Z30430 Encounter for insertion of intrauterine contraceptive device: Secondary | ICD-10-CM | POA: Diagnosis not present

## 2015-07-13 DIAGNOSIS — Z01812 Encounter for preprocedural laboratory examination: Secondary | ICD-10-CM

## 2015-07-13 LAB — POCT URINE PREGNANCY: Preg Test, Ur: NEGATIVE

## 2015-07-13 MED ORDER — LEVONORGESTREL 20 MCG/24HR IU IUD
INTRAUTERINE_SYSTEM | Freq: Once | INTRAUTERINE | Status: AC
  Administered 2015-07-13: 15:00:00 via INTRAUTERINE

## 2015-07-13 NOTE — Progress Notes (Signed)
  Subjective:     Brittney Moore is a 35 y.o. female who presents for a postpartum visit. She is 6 weeks postpartum following a spontaneous vaginal delivery. I have fully reviewed the prenatal and intrapartum course. The delivery was at term gestational weeks. Outcome: spontaneous vaginal delivery. Anesthesia: none. Postpartum course has been uneventful except for some SP bone pain and vaginal bleeding. Baby's course has been uneventful. Baby is feeding by breast. Bleeding moderate lochia. Bowel function is normal. Bladder function is normal. Patient is not sexually active. Contraception method is IUD. Postpartum depression screening: negative.  The following portions of the patient's history were reviewed and updated as appropriate: allergies, current medications, past family history, past medical history, past social history, past surgical history and problem list.  Review of Systems Pertinent items are noted in HPI.   Objective:    BP 119/83 mmHg  Pulse 91  Resp 16  Ht 5\' 6"  (1.676 m)  Wt 239 lb (108.41 kg)  BMI 38.59 kg/m2  Breastfeeding? Yes  General:  alert, cooperative and no distress   Breasts:  inspection negative, no nipple discharge or bleeding, no masses or nodularity palpable  Lungs: clear to auscultation bilaterally  Heart:  regular rate and rhythm, S1, S2 normal, no murmur, click, rub or gallop  Abdomen: soft, non-tender; bowel sounds normal; no masses,  no organomegaly   Vulva:  normal  Vagina: normal vagina, no discharge, exudate, lesion, or erythema  Cervix:  anteverted  Corpus: normal size, contour, position, consistency, mobility, non-tender  Adnexa:  no mass, fullness, tenderness  Rectal Exam: Not performed.        Patient identified, informed consent performed, signed copy in chart, time out was performed.  Urine pregnancy test negative.  Speculum placed in the vagina.  Cervix visualized.  Cleaned with Betadine x 2.  Grasped posteriorly with a single tooth  tenaculum.  Uterus sounded to 5.  Mirena IUD placed per manufacturer's recommendations.  Strings trimmed to 3 cm.   Patient given post procedure instructions and Mirena care card with expiration date.  Patient is asked to check IUD strings periodically and follow up in 4-6 weeks for IUD check.  Assessment:     Normal postpartum exam. Pap smear not done at today's visit.   Plan:    1. Contraception: IUD 2. String check one month, Let us know if you have intolerable bleeding, will add OCPs prn. 3. Follow up in: 1 month or as needed.

## 2015-07-13 NOTE — Patient Instructions (Signed)
Levonorgestrel intrauterine device (IUD) What is this medicine? LEVONORGESTREL IUD (LEE voe nor jes trel) is a contraceptive (birth control) device. The device is placed inside the uterus by a healthcare professional. It is used to prevent pregnancy and can also be used to treat heavy bleeding that occurs during your period. Depending on the device, it can be used for 3 to 5 years. This medicine may be used for other purposes; ask your health care provider or pharmacist if you have questions. What should I tell my health care provider before I take this medicine? They need to know if you have any of these conditions: -abnormal Pap smear -cancer of the breast, uterus, or cervix -diabetes -endometritis -genital or pelvic infection now or in the past -have more than one sexual partner or your partner has more than one partner -heart disease -history of an ectopic or tubal pregnancy -immune system problems -IUD in place -liver disease or tumor -problems with blood clots or take blood-thinners -use intravenous drugs -uterus of unusual shape -vaginal bleeding that has not been explained -an unusual or allergic reaction to levonorgestrel, other hormones, silicone, or polyethylene, medicines, foods, dyes, or preservatives -pregnant or trying to get pregnant -breast-feeding How should I use this medicine? This device is placed inside the uterus by a health care professional. Talk to your pediatrician regarding the use of this medicine in children. Special care may be needed. Overdosage: If you think you have taken too much of this medicine contact a poison control center or emergency room at once. NOTE: This medicine is only for you. Do not share this medicine with others. What if I miss a dose? This does not apply. What may interact with this medicine? Do not take this medicine with any of the following medications: -amprenavir -bosentan -fosamprenavir This medicine may also interact with  the following medications: -aprepitant -barbiturate medicines for inducing sleep or treating seizures -bexarotene -griseofulvin -medicines to treat seizures like carbamazepine, ethotoin, felbamate, oxcarbazepine, phenytoin, topiramate -modafinil -pioglitazone -rifabutin -rifampin -rifapentine -some medicines to treat HIV infection like atazanavir, indinavir, lopinavir, nelfinavir, tipranavir, ritonavir -St. John's wort -warfarin This list may not describe all possible interactions. Give your health care provider a list of all the medicines, herbs, non-prescription drugs, or dietary supplements you use. Also tell them if you smoke, drink alcohol, or use illegal drugs. Some items may interact with your medicine. What should I watch for while using this medicine? Visit your doctor or health care professional for regular check ups. See your doctor if you or your partner has sexual contact with others, becomes HIV positive, or gets a sexual transmitted disease. This product does not protect you against HIV infection (AIDS) or other sexually transmitted diseases. You can check the placement of the IUD yourself by reaching up to the top of your vagina with clean fingers to feel the threads. Do not pull on the threads. It is a good habit to check placement after each menstrual period. Call your doctor right away if you feel more of the IUD than just the threads or if you cannot feel the threads at all. The IUD may come out by itself. You may become pregnant if the device comes out. If you notice that the IUD has come out use a backup birth control method like condoms and call your health care provider. Using tampons will not change the position of the IUD and are okay to use during your period. What side effects may I notice from receiving this medicine?   Side effects that you should report to your doctor or health care professional as soon as possible: -allergic reactions like skin rash, itching or  hives, swelling of the face, lips, or tongue -fever, flu-like symptoms -genital sores -high blood pressure -no menstrual period for 6 weeks during use -pain, swelling, warmth in the leg -pelvic pain or tenderness -severe or sudden headache -signs of pregnancy -stomach cramping -sudden shortness of breath -trouble with balance, talking, or walking -unusual vaginal bleeding, discharge -yellowing of the eyes or skin Side effects that usually do not require medical attention (report to your doctor or health care professional if they continue or are bothersome): -acne -breast pain -change in sex drive or performance -changes in weight -cramping, dizziness, or faintness while the device is being inserted -headache -irregular menstrual bleeding within first 3 to 6 months of use -nausea This list may not describe all possible side effects. Call your doctor for medical advice about side effects. You may report side effects to FDA at 1-800-FDA-1088. Where should I keep my medicine? This does not apply. NOTE: This sheet is a summary. It may not cover all possible information. If you have questions about this medicine, talk to your doctor, pharmacist, or health care provider.    2016, Elsevier/Gold Standard. (2011-04-07 13:54:04)  

## 2015-08-12 ENCOUNTER — Other Ambulatory Visit (HOSPITAL_COMMUNITY): Payer: Self-pay | Admitting: Obstetrics & Gynecology

## 2015-08-12 ENCOUNTER — Encounter: Payer: Self-pay | Admitting: Obstetrics & Gynecology

## 2015-08-12 ENCOUNTER — Ambulatory Visit (INDEPENDENT_AMBULATORY_CARE_PROVIDER_SITE_OTHER): Payer: BLUE CROSS/BLUE SHIELD | Admitting: Obstetrics & Gynecology

## 2015-08-12 VITALS — BP 110/70 | HR 71 | Resp 16 | Ht 66.0 in | Wt 241.0 lb

## 2015-08-12 DIAGNOSIS — T8332XA Displacement of intrauterine contraceptive device, initial encounter: Secondary | ICD-10-CM | POA: Diagnosis not present

## 2015-08-12 DIAGNOSIS — Z30431 Encounter for routine checking of intrauterine contraceptive device: Secondary | ICD-10-CM | POA: Diagnosis not present

## 2015-08-12 DIAGNOSIS — Z8632 Personal history of gestational diabetes: Secondary | ICD-10-CM

## 2015-08-12 DIAGNOSIS — O24419 Gestational diabetes mellitus in pregnancy, unspecified control: Secondary | ICD-10-CM

## 2015-08-12 NOTE — Progress Notes (Signed)
   Subjective:    Patient ID: Brittney Moore, female    DOB: 11/02/1980, 35 y.o.   MRN: [id number]  HPI 35 yo MW P4 is here for a 2 hour GTT and for a string check. She had a Mirena placed several weeks ago. She reports daily spotting. She is pumping breast milk for her daughter.   Review of Systems     Objective:   Physical Exam Obese pleasant WFNAD Breathing, conversing, and ambulating normally 3 cm asymptomatic right Bartholin's cyst (She does not want a I&D at this time) I was unable to visualize her strings, partly because of the obstruction provided by the Bartholin's cyst Bedside u/s reveals IUD in proper position       Assessment & Plan:  H/O GDM, morbid obesity- 2 hour GTT today Contraception- IUD

## 2015-08-13 ENCOUNTER — Telehealth: Payer: Self-pay | Admitting: *Deleted

## 2015-08-13 LAB — GLUCOSE, RANDOM: Glucose, Bld: 197 mg/dL — ABNORMAL HIGH (ref 65–99)

## 2015-08-13 LAB — GLUCOSE TOLERANCE, 2 HOURS
Glucose, 2 hour: 200 mg/dL — ABNORMAL HIGH (ref <140)
Glucose, Fasting: 89 mg/dL (ref 65–99)

## 2015-08-13 NOTE — Telephone Encounter (Signed)
Pt notified of 2 hr GTT  F-89 1hr-197 and 2hr 200.  Pt states that she got somewhat different results with her glucometer as she was getting her blood draws yesterday.  She [facility]  Pt will f[facility] her PCP

## 2021-08-17 ENCOUNTER — Other Ambulatory Visit (HOSPITAL_COMMUNITY)
Admission: RE | Admit: 2021-08-17 | Discharge: 2021-08-17 | Disposition: A | Discharge status: Home / Self Care | Payer: BC Managed Care – PPO | Source: Ambulatory Visit

## 2021-08-17 ENCOUNTER — Ambulatory Visit (INDEPENDENT_AMBULATORY_CARE_PROVIDER_SITE_OTHER): Payer: BC Managed Care – PPO

## 2021-08-17 VITALS — BP 126/83 | HR 60 | Ht 66.0 in | Wt 197.0 lb

## 2021-08-17 DIAGNOSIS — Z113 Encounter for screening for infections with a predominantly sexual mode of transmission: Secondary | ICD-10-CM | POA: Diagnosis not present

## 2021-08-17 DIAGNOSIS — Z30432 Encounter for removal of intrauterine contraceptive device: Secondary | ICD-10-CM | POA: Diagnosis not present

## 2021-08-17 NOTE — Progress Notes (Signed)
   GYNECOLOGY PROGRESS NOTE  History:  41 y.o. [id number] presents to [facility] office today for IUD removal and STI testing. No issues with IUD. No longer desires as she is not currently sexually active. Placed 6 years ago. She denies any known exposure to STI's. No itching, odor, abnormal bleeding, or abdominal/pelvic pain. Not currently sexually active as she and husband are no longer together.   The following portions of the patient's history were reviewed and updated as appropriate: allergies, current medications, past family history, past medical history, past social history, past surgical history and problem list. Last pap smear on 04/2014 was normal, negative HRHPV.  Health Maintenance Due  Topic Date Due   COVID-19 Vaccine (1) Never done   FOOT EXAM  Never done   OPHTHALMOLOGY EXAM  Never done   URINE MICROALBUMIN  Never done   Hepatitis C Screening  Never done   HEMOGLOBIN A1C  05/07/2015   PAP SMEAR-Modifier  05/07/2017     Review of Systems:  Pertinent items are noted in HPI.   Objective:  Physical Exam Blood pressure 126/83, pulse 60, height 5\' 6"  (1.676 m), weight 197 lb (89.4 kg), currently breastfeeding. VS reviewed, nursing note reviewed,  Constitutional: well developed, well nourished, no distress HEENT: normocephalic CV: normal rate Pulm/chest wall: normal effort Breast Exam: deferred Abdomen: soft Neuro: alert and oriented x 3 Skin: warm, dry Psych: affect normal Pelvic exam: Cervix pink, visually closed, without lesion, scant white creamy discharge, IUD strings visualized, vaginal walls and external genitalia normal   IUD Removal  Patient was in the dorsal lithotomy position, normal external genitalia was noted.  A speculum was placed in the patient's vagina, normal discharge was noted, no lesions. The multiparous cervix was visualized, no lesions, no abnormal discharge.  The strings of the IUD were visualized, however not easily grasped by ring forcep so  cytobrush was used. The IUD strings were grasped and pulled using ring forceps.The IUD was removed in its entirety. Patient tolerated the procedure well.    Patient is not currently sexually active and declines contraception. Routine preventative health maintenance measures emphasized.   Assessment & Plan:  1. Encounter for IUD removal - IUD removed without difficulty - Last pap was 2016. Patient instructed to schedule appointment for annual/pap as soon as possible  2. Routine screening for STI (sexually transmitted infection)  - Cervicovaginal ancillary only( [place]) - HIV antibody (with reflex) - Hepatitis C Antibody - Hepatitis B Surface AntiGEN - RPR   No follow-ups on file.   2017, CNM 1:38 PM

## 2021-08-17 NOTE — Progress Notes (Signed)
Pt here for IUD removal and STI testing- will schedule annual

## 2021-08-17 NOTE — Progress Notes (Deleted)
   Subjective:     Brittney Moore is a 41 y.o. female here at [facility] *** for a routine exam.  Current complaints: ***.  Personal health questionnaire reviewed: {yes/no:9010}.  Do you have a primary care provider? *** Do you feel safe at home? ***  Flowsheet Row Nutrition from 02/19/2014 in [facility]  PHQ-2 Total Score 0       Health Maintenance Due  Topic Date Due   COVID-19 Vaccine (1) Never done   FOOT EXAM  Never done   OPHTHALMOLOGY EXAM  Never done   URINE MICROALBUMIN  Never done   Hepatitis C Screening  Never done   HEMOGLOBIN A1C  05/07/2015   PAP SMEAR-Modifier  05/07/2017     Risk factors for chronic health problems: Smoking: Alchohol/how much: Pt BMI: There is no height or weight on file to calculate BMI.   Gynecologic History No LMP recorded. (Menstrual status: IUD). Contraception: {method:5051} Last Pap: ***. Results were: {norm/abn:16337} Last mammogram: ***. Results were: {norm/abn:16337}  Obstetric History OB History  Gravida Para Term Preterm AB Living  7 5 4 1 2 4   SAB IAB Ectopic Multiple Live Births  2     1 6     # Outcome Date GA Lbr Len/2nd Weight Sex Delivery Anes PTL Lv  7 Term 06/03/15 [redacted]w[redacted]d / 00:02 7 lb 9.3 oz (3.44 kg) F Vag-Spont None  LIV     Birth Comments: Facial Bruising  6 Term 04/02/14 [redacted]w[redacted]d  8 lb 0.8 oz (3.651 kg) F Vag-Spont None  LIV  5 Term 10/30/00 [redacted]w[redacted]d  7 lb (3.175 kg) F Vag-Spont   LIV     Birth Comments: No antenatal testing  4 Term 08/30/99 [redacted]w[redacted]d  6 lb 15 oz (3.147 kg) M Vag-Spont   LIV     Birth Comments: No antenatal testin  3A Preterm 09/16/93 [redacted]w[redacted]d    Vag-Spont  N ND  3B Preterm 09/16/93 [redacted]w[redacted]d    Vag-Spont  N ND  2 SAB           1 SAB              {Common ambulatory SmartLinks:19316}  Review of Systems {ros; complete:30496}    Objective:   There were no vitals taken for this visit. VS reviewed, nursing note reviewed,  Constitutional: well developed, well nourished, no  distress HEENT: normocephalic CV: normal rate Pulm/chest wall: normal effort Breast Exam:  ***Deferred with low risks and shared decision making, discussed recommendation to start mammogram between 40-50 yo/ exam performed: right breast normal without mass, skin or nipple changes or axillary nodes, left breast normal without mass, skin or nipple changes or axillary nodes Abdomen: soft Neuro: alert and oriented x 3 Skin: warm, dry Psych: affect normal Pelvic exam: ***Deferred/ Performed: Cervix pink, visually closed, without lesion, scant white creamy discharge, vaginal walls and external genitalia normal Bimanual exam: Cervix 0/long/high, firm, anterior, neg CMT, uterus nontender, nonenlarged, adnexa without tenderness, enlargement, or mass       Assessment/Plan:   There are no diagnoses linked to this encounter.     No follow-ups on file.   09/18/93, CNM 1:06 PM

## 2021-08-18 LAB — CERVICOVAGINAL ANCILLARY ONLY
Chlamydia: NEGATIVE
Comment: NEGATIVE
Comment: NEGATIVE
Comment: NORMAL
Neisseria Gonorrhea: NEGATIVE
Trichomonas: NEGATIVE

## 2021-08-18 LAB — HEPATITIS C ANTIBODY
Hepatitis C Ab: NONREACTIVE
SIGNAL TO CUT-OFF: 0.07 (ref <1.00)

## 2021-08-18 LAB — HEPATITIS B SURFACE ANTIGEN: Hepatitis B Surface Ag: NONREACTIVE

## 2021-08-18 LAB — HIV ANTIBODY (ROUTINE TESTING W REFLEX): HIV 1&2 Ab, 4th Generation: NONREACTIVE

## 2021-08-18 LAB — RPR: RPR Ser Ql: NONREACTIVE

## 2021-09-28 ENCOUNTER — Ambulatory Visit (INDEPENDENT_AMBULATORY_CARE_PROVIDER_SITE_OTHER): Payer: BC Managed Care – PPO

## 2021-09-28 ENCOUNTER — Other Ambulatory Visit (HOSPITAL_COMMUNITY)
Admission: RE | Admit: 2021-09-28 | Discharge: 2021-09-28 | Disposition: A | Discharge status: Home / Self Care | Payer: BC Managed Care – PPO | Source: Ambulatory Visit

## 2021-09-28 VITALS — BP 114/72 | HR 61 | Resp 16 | Ht 66.0 in | Wt 200.0 lb

## 2021-09-28 DIAGNOSIS — Z1231 Encounter for screening mammogram for malignant neoplasm of breast: Secondary | ICD-10-CM | POA: Diagnosis not present

## 2021-09-28 DIAGNOSIS — Z01419 Encounter for gynecological examination (general) (routine) without abnormal findings: Secondary | ICD-10-CM | POA: Insufficient documentation

## 2021-09-28 NOTE — Progress Notes (Signed)
   Subjective:     Brittney Moore is a 41 y.o. female here at [facility] for a routine exam.  Current complaints: none.     Flowsheet Row Nutrition from 02/19/2014 in [facility]  PHQ-2 Total Score 0       Health Maintenance Due  Topic Date Due   COVID-19 Vaccine (1) Never done   FOOT EXAM  Never done   OPHTHALMOLOGY EXAM  Never done   URINE MICROALBUMIN  Never done   HEMOGLOBIN A1C  05/07/2015   PAP SMEAR-Modifier  05/07/2017     Risk factors for chronic health problems: Smoking: No Alchohol/how much: No Illicit drug use: No Pt BMI: Body mass index is 32.28 kg/m.   Gynecologic History Patient's last menstrual period was 09/21/2021. Contraception: abstinence Sexual health: Not sexually active Last Pap: 05/07/2014. Results were: normal Last mammogram: n/a  Obstetric History OB History  Gravida Para Term Preterm AB Living  7 5 4 1 2 4   SAB IAB Ectopic Multiple Live Births  2     1 6     # Outcome Date GA Lbr Len/2nd Weight Sex Delivery Anes PTL Lv  7 Term 06/03/15 [redacted]w[redacted]d / 00:02 7 lb 9.3 oz (3.44 kg) F Vag-Spont None  LIV     Birth Comments: Facial Bruising  6 Term 04/02/14 [redacted]w[redacted]d  8 lb 0.8 oz (3.651 kg) F Vag-Spont None  LIV  5 Term 10/30/00 [redacted]w[redacted]d  7 lb (3.175 kg) F Vag-Spont   LIV     Birth Comments: No antenatal testing  4 Term 08/30/99 [redacted]w[redacted]d  6 lb 15 oz (3.147 kg) M Vag-Spont   LIV     Birth Comments: No antenatal testin  3A Preterm 09/16/93 [redacted]w[redacted]d    Vag-Spont  N ND  3B Preterm 09/16/93 [redacted]w[redacted]d    Vag-Spont  N ND  2 SAB           1 SAB             The following portions of the patient's history were reviewed and updated as appropriate: allergies, current medications, past family history, past medical history, past social history, past surgical history, and problem list.  Review of Systems Pertinent items are noted in HPI.    Objective:   BP 114/72   Pulse 61   Resp 16   Ht 5\' 6"  (1.676 m)   Wt 200 lb (90.7 kg)    LMP 09/21/2021   Breastfeeding No   BMI 32.28 kg/m  VS reviewed, nursing note reviewed,  Constitutional: well developed, well nourished, no distress HEENT: normocephalic CV: normal rate Pulm/chest wall: normal effort Breast Exam: right breast normal without mass, skin or nipple changes or axillary nodes, left breast normal without mass, skin or nipple changes or axillary nodes Abdomen: soft Neuro: alert and oriented x 3 Skin: warm, dry Psych: affect normal Pelvic exam: Cervix pink, visually closed, without lesion, scant white creamy discharge, vaginal walls and external genitalia normal Bimanual exam: Cervix 0/long/high, firm, anterior, neg CMT, uterus nontender, nonenlarged, adnexa without tenderness, enlargement, or mass      Assessment/Plan:   1. Well woman exam with routine gynecological exam - Normal well woman exam - Recent STI screening negative  - Cytology - PAP( [place])  2. Encounter for screening mammogram for malignant neoplasm of breast  - MM 3D SCREEN BREAST BILATERAL; Future    Return in about 1 year (around 09/29/2022).   , CNM 1:31 PM

## 2021-09-30 ENCOUNTER — Ambulatory Visit (INDEPENDENT_AMBULATORY_CARE_PROVIDER_SITE_OTHER): Payer: BC Managed Care – PPO

## 2021-09-30 DIAGNOSIS — Z1231 Encounter for screening mammogram for malignant neoplasm of breast: Secondary | ICD-10-CM | POA: Diagnosis not present

## 2021-09-30 LAB — CYTOLOGY - PAP
Comment: NEGATIVE
Diagnosis: NEGATIVE
High risk HPV: NEGATIVE

## 2021-10-14 ENCOUNTER — Telehealth: Payer: Self-pay | Admitting: *Deleted

## 2021-10-14 NOTE — Telephone Encounter (Signed)
Returning pt's call, it went to voicemail, LM to return my call.

## 2021-10-19 ENCOUNTER — Ambulatory Visit: Payer: BC Managed Care – PPO | Admitting: Obstetrics and Gynecology

## 2022-07-15 ENCOUNTER — Ambulatory Visit: Payer: BC Managed Care – PPO | Admitting: Obstetrics and Gynecology

## 2022-07-19 ENCOUNTER — Ambulatory Visit: Payer: BC Managed Care – PPO | Admitting: Certified Nurse Midwife

## 2022-07-19 ENCOUNTER — Encounter: Payer: Self-pay | Admitting: Certified Nurse Midwife

## 2022-07-19 VITALS — BP 118/76 | HR 60 | Ht 66.0 in | Wt 204.0 lb

## 2022-07-19 DIAGNOSIS — Z30430 Encounter for insertion of intrauterine contraceptive device: Secondary | ICD-10-CM

## 2022-07-19 LAB — POCT URINE PREGNANCY: Preg Test, Ur: NEGATIVE

## 2022-07-19 MED ORDER — LEVONORGESTREL 20 MCG/DAY IU IUD
1.0000 | INTRAUTERINE_SYSTEM | Freq: Once | INTRAUTERINE | Status: AC
  Administered 2022-07-19: 1 via INTRAUTERINE

## 2022-07-19 MED ORDER — PARAGARD INTRAUTERINE COPPER IU IUD
1.0000 | INTRAUTERINE_SYSTEM | Freq: Once | INTRAUTERINE | Status: DC
Start: 2022-07-19 — End: 2022-07-19

## 2022-07-19 NOTE — Progress Notes (Signed)
GYNECOLOGY OFFICE VISIT NOTE  History:  42 y.o. [id number] here today for IUD insertion. Reports heavier more painful periods recently. She was originally planning Paragard so that she would know when she goes through menopause but has decided on Mirena since her periods are heavy already. Had Mirena in past and did well.   Past Medical History:  Diagnosis Date   Anemia    Gestational diabetes    Gestational diabetes mellitus, antepartum    History of stomach ulcers    Vitamin D deficiency     Past Surgical History:  Procedure Laterality Date   BARTHOLIN GLAND CYST EXCISION     CHOLECYSTECTOMY     TONSILLECTOMY AND ADENOIDECTOMY     WISDOM TOOTH EXTRACTION      Family History  Problem Relation Age of Onset   Congestive Heart Failure Mother    Leukemia Father    Heart disease Father    Diabetes Father    Cancer Maternal Grandfather    Mental illness Paternal Grandmother    Cancer Paternal Grandfather     Social History   Socioeconomic History   Marital status: Divorced    Spouse name: Not on file   Number of children: Not on file   Years of education: Not on file   Highest education level: Not on file  Occupational History   Occupation: Conservation officer, [employer]: [employer]  Tobacco Use   Smoking status: Never   Smokeless tobacco: Never  Vaping Use   Vaping Use: Never used  Substance and Sexual Activity   Alcohol use: No   Drug use: No   Sexual activity: Not Currently    Partners: Male    Birth control/protection: None  Other Topics Concern   Not on file  Social History Narrative   Not on file   Social Determinants of Health   Financial Resource Strain: Not on file  Food Insecurity: Not on file  Transportation Needs: Not on file  Physical Activity: Not on file  Stress: Not on file  Social Connections: Not on file     The following portions of the patient's history were reviewed and updated as appropriate: allergies, current  medications, past family history, past medical history, past social history, past surgical history and problem list.   Health Maintenance:      Component Value Date/Time   DIAGPAP  09/28/2021 1311    - Negative for intraepithelial lesion or malignancy (NILM)   HPVHIGH Negative 09/28/2021 1311   ADEQPAP  09/28/2021 1311    Satisfactory for evaluation; transformation zone component PRESENT.   Review of Systems:  Negative except noted in HPI  Objective:  Physical Exam BP 118/76   Pulse 60   Ht 5\' 6"  (1.676 m)   Wt 204 lb (92.5 kg)   LMP 07/10/2022   BMI 32.93 kg/m  CONSTITUTIONAL: Well-developed, well-nourished female in no acute distress.  HENT:  Normocephalic, atraumatic EYES: Conjunctivae and EOM are normal NECK: Normal range of motion SKIN: Skin is warm and dry NEUROLOGIC: Alert and oriented to person, place, and time PSYCHIATRIC: Normal mood and affect CARDIOVASCULAR: Normal heart rate noted RESPIRATORY: Effort and rate normal ABDOMEN: Soft, NT, no distention  PELVIC: Normal appearing external genitalia; normal appearing vaginal mucosa and cervix.  No abnormal discharge noted.  Normal uterine size, no other palpable masses, no uterine or adnexal tenderness. MUSCULOSKELETAL: Normal range of motion  Labs and Imaging Results for orders placed or performed in visit on 07/19/22 (from  the past 24 hour(s))  POCT urine pregnancy     Status: None   Collection Time: 07/19/22  9:46 AM  Result Value Ref Range   Preg Test, Ur Negative Negative   Procedure Note: Pt consented for IUD insertion.  Pt placed in lithotomy position.  Speculum inserted and cervix cleaned with betadine solution.  Uterus sounded to 6 cm; Mirena IUD inserted without difficulty.  Strings cut at approximately 3 cm.  Speculum removed. Tolerated well.  Assessment & Plan:   1. Encounter for IUD insertion    Follow up in 1 month for string check  I spent 15 minutes dedicated to the care of this patient  including pre-visit review of records, face to face time with the patient discussing her conditions and treatments and post visit ordering of testing.  Donette Larry, CNM 07/19/2022 10:39 AM

## 2022-08-16 ENCOUNTER — Ambulatory Visit: Payer: BC Managed Care – PPO

## 2022-08-30 ENCOUNTER — Encounter: Payer: Self-pay | Admitting: Certified Nurse Midwife

## 2022-08-30 ENCOUNTER — Ambulatory Visit: Payer: BC Managed Care – PPO | Admitting: Certified Nurse Midwife

## 2022-08-30 ENCOUNTER — Other Ambulatory Visit (HOSPITAL_COMMUNITY)
Admission: RE | Admit: 2022-08-30 | Discharge: 2022-08-30 | Disposition: A | Discharge status: Home / Self Care | Payer: BC Managed Care – PPO | Source: Ambulatory Visit | Attending: Certified Nurse Midwife | Admitting: Certified Nurse Midwife

## 2022-08-30 VITALS — BP 126/85 | HR 67 | Ht 66.0 in | Wt 215.0 lb

## 2022-08-30 DIAGNOSIS — B9689 Other specified bacterial agents as the cause of diseases classified elsewhere: Secondary | ICD-10-CM | POA: Diagnosis not present

## 2022-08-30 DIAGNOSIS — Z113 Encounter for screening for infections with a predominantly sexual mode of transmission: Secondary | ICD-10-CM | POA: Diagnosis not present

## 2022-08-30 DIAGNOSIS — N760 Acute vaginitis: Secondary | ICD-10-CM

## 2022-08-30 DIAGNOSIS — B3731 Acute candidiasis of vulva and vagina: Secondary | ICD-10-CM | POA: Diagnosis not present

## 2022-08-30 DIAGNOSIS — Z30431 Encounter for routine checking of intrauterine contraceptive device: Secondary | ICD-10-CM | POA: Diagnosis not present

## 2022-08-30 DIAGNOSIS — N750 Cyst of Bartholin's gland: Secondary | ICD-10-CM | POA: Diagnosis not present

## 2022-08-30 NOTE — Progress Notes (Signed)
GYNECOLOGY OFFICE VISIT NOTE  History:  42 y.o. [id number] here today for IUD string check. Mirena IUD was placed 6 weeks ago. She reports light pink spotting most days, uses liners to protect clothes. Denies pain with IC but bartholin cyst feels sore after. Denies vaginal discharge, itching, or malodor. Reports new partner. Reports partner gets sores on his penis (mid shaft) after they have sex since IUD placed. States they both tested for STDs prior to sexual activity.   Past Medical History:  Diagnosis Date   Anemia    Gestational diabetes    Gestational diabetes mellitus, antepartum    History of stomach ulcers    Vitamin D deficiency     Past Surgical History:  Procedure Laterality Date   BARTHOLIN GLAND CYST EXCISION     CHOLECYSTECTOMY     TONSILLECTOMY AND ADENOIDECTOMY     WISDOM TOOTH EXTRACTION      The following portions of the patient's history were reviewed and updated as appropriate: allergies, current medications, past family history, past medical history, past social history, past surgical history and problem list.   Review of Systems:  Pertinent items noted in HPI and remainder of comprehensive ROS otherwise negative.  Objective:  Physical Exam BP 126/85   Pulse 67   Ht 5\' 6"  (1.676 m)   Wt 215 lb (97.5 kg)   BMI 34.70 kg/m  CONSTITUTIONAL: Well-developed, well-nourished female in no acute distress.  HENT:  Normocephalic, atraumatic.  SKIN: Skin is warm and dry. No rash noted. Not diaphoretic. No erythema. No pallor. NEUROLOGIC: Alert and oriented to person, place, and time.  PSYCHIATRIC: Normal mood and affect. Normal behavior. Normal judgment and thought content. CARDIOVASCULAR: Normal heart rate noted RESPIRATORY: Effort and rate normal ABDOMEN: Soft, no distention noted.   PELVIC: Normal appearing external genitalia; large bartholin cyst on right side; normal appearing vaginal mucosa and cervix. IUD string seen, normal length. No abnormal discharge noted.     Assessment & Plan:  1. Screen for STD (sexually transmitted disease) - Cervicovaginal ancillary only( ) - HIV antibody (with reflex) - RPR - Hepatitis C Antibody - Hepatitis B Surface AntiGEN - recommend her partner seek care if sores continue  2. IUD check up  - spotting should resolve 3-6 mos  3. Bartholin cyst - reports pain after IC - had abscess in past - recommend consult with MD to discuss surgical mngt   Please refer to After Visit Summary for other counseling recommendations.   No follow-ups on file.   Donette Larry, CNM 08/30/2022 12:05 PM

## 2022-08-30 NOTE — Progress Notes (Signed)
Problems with intercourse

## 2022-08-31 ENCOUNTER — Other Ambulatory Visit: Payer: Self-pay

## 2022-08-31 DIAGNOSIS — Z1231 Encounter for screening mammogram for malignant neoplasm of breast: Secondary | ICD-10-CM

## 2022-08-31 LAB — CERVICOVAGINAL ANCILLARY ONLY
Bacterial Vaginitis (gardnerella): POSITIVE — AB
Candida Glabrata: NEGATIVE
Candida Vaginitis: POSITIVE — AB
Chlamydia: NEGATIVE
Comment: NEGATIVE
Comment: NEGATIVE
Comment: NEGATIVE
Comment: NEGATIVE
Comment: NEGATIVE
Comment: NORMAL
Neisseria Gonorrhea: NEGATIVE
Trichomonas: NEGATIVE

## 2022-08-31 LAB — RPR: RPR Ser Ql: NONREACTIVE

## 2022-08-31 LAB — HEPATITIS B SURFACE ANTIGEN: Hepatitis B Surface Ag: NEGATIVE

## 2022-08-31 LAB — HIV ANTIBODY (ROUTINE TESTING W REFLEX): HIV Screen 4th Generation wRfx: NONREACTIVE

## 2022-08-31 LAB — HEPATITIS C ANTIBODY: Hep C Virus Ab: NONREACTIVE

## 2022-09-01 MED ORDER — METRONIDAZOLE 500 MG PO TABS
500.0000 mg | ORAL_TABLET | Freq: Two times a day (BID) | ORAL | 0 refills | Status: DC
Start: 2022-09-01 — End: 2022-10-06

## 2022-09-01 MED ORDER — TERCONAZOLE 0.4 % VA CREA
1.0000 | TOPICAL_CREAM | Freq: Every day | VAGINAL | 0 refills | Status: DC
Start: 2022-09-01 — End: 2022-10-06

## 2022-09-01 NOTE — Addendum Note (Signed)
Addended by: Donette Larry E on: 09/01/2022 09:33 AM   Modules accepted: Orders

## 2022-10-06 ENCOUNTER — Encounter: Payer: Self-pay | Admitting: Obstetrics and Gynecology

## 2022-10-06 ENCOUNTER — Ambulatory Visit: Payer: BC Managed Care – PPO | Admitting: Obstetrics and Gynecology

## 2022-10-06 VITALS — BP 123/79 | HR 62 | Resp 16 | Ht 66.0 in | Wt 215.0 lb

## 2022-10-06 DIAGNOSIS — N750 Cyst of Bartholin's gland: Secondary | ICD-10-CM

## 2022-10-06 NOTE — Progress Notes (Signed)
RETURN GYNECOLOGY VISIT  Subjective:  Brittney Moore is a 42 y.o. [phone] with IUD in place presenting to discuss Bartholin's gland cyst.   Had bartholin's on left side during pregnancy in 2001 that was treated with word catheter.  First noticed this cyst last May. Fluctuates in size a little but never goes away completely. Bothers her when riding a bike and with IC. No fevers/chills.   Past Medical History:  Diagnosis Date   Anemia    Gestational diabetes    Gestational diabetes mellitus, antepartum    History of stomach ulcers    Vitamin D deficiency    Past Surgical History:  Procedure Laterality Date   BARTHOLIN GLAND CYST EXCISION     CHOLECYSTECTOMY     TONSILLECTOMY AND ADENOIDECTOMY     WISDOM TOOTH EXTRACTION     Current Outpatient Medications on File Prior to Visit  Medication Sig Dispense Refill   Ascorbic Acid (VITAMIN C) 100 MG tablet Take 100 mg by mouth daily.     ferrous sulfate 325 (65 FE) MG EC tablet Take 325 mg by mouth 3 (three) times daily with meals.     levonorgestrel (MIRENA) 20 MCG/DAY IUD 1 each by Intrauterine route once.     Misc Natural Products (FIBER 7 PO) Take by mouth.     Multiple Vitamin (MULTIVITAMIN ADULT PO) Take by mouth.     No current facility-administered medications on file prior to visit.   Allergies  Allergen Reactions   Other Itching and Nausea And Vomiting    Paprika Paprika Paprika   Cephalexin Rash   OB History     Gravida  7   Para  5   Term  4   Preterm  1   AB  2   Living  4      SAB  2   IAB      Ectopic      Multiple  1   Live Births  6          Social History   Socioeconomic History   Marital status: Divorced    Spouse name: Not on file   Number of children: Not on file   Years of education: Not on file   Highest education level: Not on file  Occupational History   Occupation: sales assoc    Employer: [employer]  Tobacco Use   Smoking status: Never    Smokeless tobacco: Never  Vaping Use   Vaping status: Never Used  Substance and Sexual Activity   Alcohol use: No   Drug use: No   Sexual activity: Not Currently    Partners: Male    Birth control/protection: I.U.D.  Other Topics Concern   Not on file  Social History Narrative   Not on file   Social Determinants of Health   Financial Resource Strain: Not on file  Food Insecurity: Not on file  Transportation Needs: Not on file  Physical Activity: Not on file  Stress: Not on file  Social Connections: Not on file  Intimate Partner Violence: Not on file   Objective:   Vitals:   10/06/22 1052  BP: 123/79  Pulse: 62  Resp: 16  Weight: 215 lb (97.5 kg)  Height: 5\' 6"  (1.676 m)    General:  Alert, oriented and cooperative. Patient is in no acute distress.  Skin: Skin is warm and dry. No rash noted.   Cardiovascular: Normal heart rate noted  Respiratory: Normal respiratory effort, no problems with  respiration noted  Abdomen: Soft, non-tender, non-distended   Pelvic: 3cm R bartholin's gland cyst. Nontender, no overlying erythema or warmth.   Exam performed in the presence of a chaperone  Assessment and Plan:  Brittney Moore is a 41 y.o. with bartholin's gland cyst now s/p Word catheter placement.   Discussed options for management including word catheter vs marsupialization. Would not recommend excision without trying word/marsupialization due to higher risk of hematoma formation/bleeding, pain, scarring. Reviewed risks/benefits of each procedure as well as anticipated recovery. Pt opted for Word placement today. See procedure note.   Return in about 1 week (around 10/13/2022) for incision/Word catheter check.  Future Appointments  Date Time Provider Department Center  10/13/2022 10:10 AM Lennart Pall, MD [facility]  10/19/2022 11:00 AM [facility]   Lennart Pall, MD

## 2022-10-06 NOTE — Progress Notes (Signed)
    GYNECOLOGY OFFICE PROCEDURE NOTE  42 y.o. [id number] with Bartholin's gland cyst here for I&D and Word catheter placement.   Informed consent and review of risks, benefit and alternatives performed. Specifically discussed risks of infection, bleeding, pain, scarring, recurrence of bartholin's gland cyst. Written consent given.   3cm, mobile Bartholin's gland cyst visualized. Incision site was prepped with betadine. The incision site was injected with 2cc of 1% lidocaine. A stab incision was made between 7-8 o'clock immediately behind the hymen. Sanguinous purulent drainage was expressed. Forceps were used to break up loculations and fully drain the cyst. The Word catheter was inserted into the incision and inflated with 3cc of saline. The end of the Word catheter was tucked into the vagina. Pt was able to feel & visualize catheter. All instruments removed. Hemostasis was good. Pt tolerated procedure well.   Patient was given post procedure instructions.  Will follow up in 1 week for incision check  Harvie Bridge, MD Obstetrician & Gynecologist, [facility] for [facility], [facility]

## 2022-10-13 ENCOUNTER — Encounter: Payer: Self-pay | Admitting: Obstetrics and Gynecology

## 2022-10-13 ENCOUNTER — Ambulatory Visit: Payer: BC Managed Care – PPO | Admitting: Obstetrics and Gynecology

## 2022-10-13 VITALS — BP 127/80 | HR 64 | Resp 16 | Ht 66.0 in | Wt 215.0 lb

## 2022-10-13 DIAGNOSIS — N750 Cyst of Bartholin's gland: Secondary | ICD-10-CM

## 2022-10-13 NOTE — Patient Instructions (Signed)
Try doing sitz baths for 20 minutes each night for 2 weeks. We will reassess at that point

## 2022-10-13 NOTE — Progress Notes (Signed)
   RETURN GYNECOLOGY VISIT  Subjective:  Brittney Moore is a 42 y.o. [phone] with IUD in place presenting for follow up of Bartholin's gland cyst.   S/p Word catheter placement in office on 7/18 Catheter fell out 2 days ago. Reports significant relief in her symptoms, no drainage  Objective:   Vitals:   10/13/22 0951  BP: 127/80  Pulse: 64  Resp: 16  Weight: 215 lb (97.5 kg)  Height: 5\' 6"  (1.676 m)   General:  Alert, oriented and cooperative. Patient is in no acute distress.  Skin: Skin is warm and dry. No rash noted.   Cardiovascular: Normal heart rate noted  Respiratory: Normal respiratory effort, no problems with respiration noted  Abdomen: Soft, non-tender, non-distended   Pelvic: Edema around area of I&D but no warmth or tenderness. Word catheter site healing over with no drainage. Potential persistent/re-accumulating 1cm cyst  Exam performed in the presence of a chaperone  Assessment and Plan:  Brittney Moore is a 42 y.o. with bartholin's gland cyst 1 week s/p Word catheter placement, has now fallen out spontaneously  Given symptomatic relief, will plan for nightly sitz bath x 2 weeks If Bartholin's is present at 2 weeks discussed option for repeat word catheter placement vs marsupialization Pt agreeable to plan  Return in about 2 weeks (around 10/27/2022).  Future Appointments  Date Time Provider Department Center  10/19/2022 11:00 AM [facility]  10/26/2022  1:50 PM Lennart Pall, MD [facility]   Lennart Pall, MD

## 2022-10-19 ENCOUNTER — Ambulatory Visit (INDEPENDENT_AMBULATORY_CARE_PROVIDER_SITE_OTHER): Payer: BC Managed Care – PPO

## 2022-10-19 DIAGNOSIS — Z1231 Encounter for screening mammogram for malignant neoplasm of breast: Secondary | ICD-10-CM | POA: Diagnosis not present

## 2022-10-26 ENCOUNTER — Ambulatory Visit: Payer: BC Managed Care – PPO | Admitting: Obstetrics and Gynecology

## 2022-10-26 ENCOUNTER — Encounter: Payer: Self-pay | Admitting: Obstetrics and Gynecology

## 2022-10-26 VITALS — BP 125/83 | HR 67 | Ht 66.0 in | Wt 215.0 lb

## 2022-10-26 DIAGNOSIS — N750 Cyst of Bartholin's gland: Secondary | ICD-10-CM

## 2022-10-26 NOTE — Progress Notes (Signed)
   RETURN GYNECOLOGY VISIT  Subjective:  Brittney Moore is a 42 y.o. [phone] with IUD in place presenting for follow up of Bartholin's gland cyst.   S/p Word catheter placement in office on 7/18 Catheter fell out 5 days later w/ significant relief in her symptoms, no drainage but small cyst present and incision site healed over on follow up Doing nightly sitz baths No current symptoms  Objective:   Vitals:   10/26/22 1347  BP: 125/83  Pulse: 67  Weight: 215 lb (97.5 kg)  Height: 5\' 6"  (1.676 m)   General:  Alert, oriented and cooperative. Patient is in no acute distress.  Skin: Skin is warm and dry. No rash noted.   Cardiovascular: Normal heart rate noted  Respiratory: Normal respiratory effort, no problems with respiration noted  Abdomen: Soft, non-tender, non-distended   Pelvic: Word catheter incision completely healed w/ no drainage. Persistent 1cm cyst  Exam performed in the presence of a chaperone  Assessment and Plan:  Brittney Moore is a 42 y.o. with persistent small (1cm) Bartholin's cyst   Likely due to Word catheter coming out prematurely and tract healing over Discussed stopping sitz baths and monitoring symptoms. If they return, plan for word catheter in office. Declines marsupialization.  OK to resume intercourse.   Return if symptoms worsen or fail to improve.  Lennart Pall, MD

## 2023-12-14 ENCOUNTER — Other Ambulatory Visit: Payer: Self-pay | Admitting: Family Medicine

## 2023-12-14 DIAGNOSIS — Z1231 Encounter for screening mammogram for malignant neoplasm of breast: Secondary | ICD-10-CM

## 2024-02-01 ENCOUNTER — Ambulatory Visit (INDEPENDENT_AMBULATORY_CARE_PROVIDER_SITE_OTHER)

## 2024-02-01 DIAGNOSIS — Z1231 Encounter for screening mammogram for malignant neoplasm of breast: Secondary | ICD-10-CM

## 2024-02-06 ENCOUNTER — Other Ambulatory Visit: Payer: Self-pay | Admitting: Family Medicine

## 2024-02-06 DIAGNOSIS — R928 Other abnormal and inconclusive findings on diagnostic imaging of breast: Secondary | ICD-10-CM

## 2024-02-12 ENCOUNTER — Ambulatory Visit (INDEPENDENT_AMBULATORY_CARE_PROVIDER_SITE_OTHER): Admitting: Obstetrics and Gynecology

## 2024-02-12 ENCOUNTER — Other Ambulatory Visit: Payer: Self-pay | Admitting: Obstetrics and Gynecology

## 2024-02-12 ENCOUNTER — Encounter: Payer: Self-pay | Admitting: Obstetrics and Gynecology

## 2024-02-12 ENCOUNTER — Other Ambulatory Visit: Payer: Self-pay

## 2024-02-12 VITALS — Ht 66.0 in | Wt 273.0 lb

## 2024-02-12 DIAGNOSIS — Z758 Other problems related to medical facilities and other health care: Secondary | ICD-10-CM | POA: Diagnosis not present

## 2024-02-12 DIAGNOSIS — Z131 Encounter for screening for diabetes mellitus: Secondary | ICD-10-CM | POA: Diagnosis not present

## 2024-02-12 DIAGNOSIS — Z01419 Encounter for gynecological examination (general) (routine) without abnormal findings: Secondary | ICD-10-CM | POA: Diagnosis not present

## 2024-02-12 DIAGNOSIS — R928 Other abnormal and inconclusive findings on diagnostic imaging of breast: Secondary | ICD-10-CM

## 2024-02-12 NOTE — Progress Notes (Signed)
 ANNUAL EXAM Patient name: Brittney Moore MRN [id number]  Date of birth: November 03, 1980 Chief Complaint:   Gynecologic Exam  History of Present Illness:   Brittney Moore is a 43 y.o. [id number] with Patient's last menstrual period was 01/30/2024. being seen today for a routine annual exam.  Current complaints:  None. BIRADS 0 MMG, needs orders signed for diagnostic MMG and US   Met patient last year when we were dealing with a bartholin's. She can still feel it from time to time but it is not causing any issues and is not changing in size   Upstream - 02/12/24 1446       Contraception Wrap Up   Current Method IUD or IUS    End Method IUD or IUS    Contraception Counseling Provided No    How was the end contraceptive method provided? N/A         The pregnancy intention screening data noted above was reviewed. Potential methods of contraception were discussed. The patient elected to proceed with IUD or IUS.   Last pap 09/28/21. Results were: NILM w/ HRHPV negative. H/O abnormal pap: no Last mammogram: 02/01/24. Results were: abnormal BI-RADS 0, has follow up diagnostic MMG + US  scheduled 12/2 (next week). Family h/o breast cancer: yes aunt Last colonoscopy: n/a Family h/o colorectal cancer: no HPV vaccine: unsure if she has received     02/12/2024    2:50 PM 02/24/2014    3:34 PM 10/15/2013   11:03 AM 09/20/2013    9:13 AM  Depression screen PHQ 2/9  Decreased Interest 0 0 0 0  Down, Depressed, Hopeless 1 0 0 0  PHQ - 2 Score 1 0 0 0  Altered sleeping 0     Tired, decreased energy 1     Change in appetite 1     Feeling bad or failure about yourself  1     Trouble concentrating 0     Moving slowly or fidgety/restless 0     Suicidal thoughts 0     PHQ-9 Score 4           02/12/2024    2:50 PM  GAD 7 : Generalized Anxiety Score  Nervous, Anxious, on Edge 1  Control/stop worrying 0  Worry too much - different things 1  Trouble relaxing 0  Restless 0  Easily annoyed  or irritable 0  Afraid - awful might happen 1  Total GAD 7 Score 3     Review of Systems:   Pertinent items are noted in HPI Denies any headaches, blurred vision, fatigue, shortness of breath, chest pain, abdominal pain, abnormal vaginal discharge/itching/odor/irritation, problems with periods, bowel movements, urination, or intercourse unless otherwise stated above. Pertinent History Reviewed:  Reviewed past medical,surgical, social and family history.  Reviewed problem list, medications and allergies. Physical Assessment:   Vitals:   02/12/24 1444  Weight: 273 lb (123.8 kg)  Height: 5' 6 (1.676 m)  Body mass index is 44.06 kg/m.        Physical Examination:   General appearance - well appearing, and in no distress  Mental status - alert, oriented to person, place, and time  Chest - respiratory effort normal  Heart - normal peripheral perfusion  Breasts - offered, opts to defer as she has diagnostic mammogram and US  scheduled  Pelvic - opts to defer as she has no pelvic complaints today  Chaperone present for exam  No results found for this or any previous visit (from the past 24 hours).  Assessment & Plan:  1) Well-Woman Exam Mammogram: follow up with diagnostic mammogram & US  as scheduled - orders signed Colonoscopy: @ 45yo, or sooner if problems Pap: Up to date, due 2028 Gardasil: Declines GC/CT/HIV/HCV:Declines Continue IUD  2) Needs PCP Referral placed and number provided for Primary Care at [facility] A1c ordered Declines flu, hpv.   Labs/procedures today:   Orders Placed This Encounter  Procedures   HgB A1c   Ambulatory referral to Family Practice   Meds: No orders of the defined types were placed in this encounter.   Follow-up: No follow-ups on file.  Kieth JAYSON Carolin, MD 02/12/2024 3:14 PM

## 2024-02-12 NOTE — Patient Instructions (Addendum)
 Primary Care & Sports Medicine at [facility] [phone]

## 2024-02-13 ENCOUNTER — Ambulatory Visit: Payer: Self-pay | Admitting: Obstetrics and Gynecology

## 2024-02-13 DIAGNOSIS — E119 Type 2 diabetes mellitus without complications: Secondary | ICD-10-CM | POA: Insufficient documentation

## 2024-02-13 LAB — HEMOGLOBIN A1C
Est. average glucose Bld gHb Est-mCnc: 140 mg/dL
Hgb A1c MFr Bld: 6.5 % — ABNORMAL HIGH (ref 4.8–5.6)

## 2024-02-14 ENCOUNTER — Telehealth: Payer: Self-pay

## 2024-02-14 DIAGNOSIS — E119 Type 2 diabetes mellitus without complications: Secondary | ICD-10-CM

## 2024-02-14 NOTE — Telephone Encounter (Signed)
 RN received voicemail from patient regarding questions about recent lab results. RN spoke with patient and discussed provider's response regarding results and need for follow up with PCP and also offered a referral to registered dietician. RN offered to assist patient with getting schedule and placing referral. Pt agreeable.   Silvano LELON Piano, RN

## 2024-02-20 ENCOUNTER — Ambulatory Visit
Admission: RE | Admit: 2024-02-20 | Discharge: 2024-02-20 | Disposition: A | Source: Ambulatory Visit | Attending: Family Medicine | Admitting: Family Medicine

## 2024-02-20 DIAGNOSIS — R928 Other abnormal and inconclusive findings on diagnostic imaging of breast: Secondary | ICD-10-CM | POA: Diagnosis not present

## 2024-02-20 DIAGNOSIS — N6489 Other specified disorders of breast: Secondary | ICD-10-CM | POA: Diagnosis not present

## 2024-02-21 ENCOUNTER — Ambulatory Visit: Payer: Self-pay | Admitting: Obstetrics and Gynecology

## 2024-03-12 ENCOUNTER — Encounter: Admitting: Skilled Nursing Facility1

## 2024-03-27 ENCOUNTER — Encounter: Attending: Obstetrics and Gynecology | Admitting: Skilled Nursing Facility1

## 2024-03-27 ENCOUNTER — Encounter: Payer: Self-pay | Admitting: Skilled Nursing Facility1

## 2024-03-27 DIAGNOSIS — E119 Type 2 diabetes mellitus without complications: Secondary | ICD-10-CM | POA: Insufficient documentation

## 2024-03-27 NOTE — Progress Notes (Signed)
 A1C 6.5    Pt states she works for [employer] and recently started a new position. Pt states she has started walking 30 minutes in the morning and then a 30 minute walk on break at work. Pt states she likes sweets and does not like vegetables.  Pt states she lives with her 4 children stating it is mostly her younger daughters that are there.    Goals: Try YouTube swing video with your girls at home Have non starchy vegetable daily oil is okay to make with it    Diabetes Self-Management Education  Visit Type: First/Initial  Appt. Start Time: 4:56 Appt. End Time: 5:56  03/28/2024  [name], identified by name and date of birth, is a 44 y.o. female with a diagnosis of Diabetes: Type 2.   ASSESSMENT  There were no vitals taken for this visit. There is no height or weight on file to calculate BMI.   Diabetes Self-Management Education - 03/27/24 1710       Visit Information   Visit Type First/Initial      Initial Visit   Diabetes Type Type 2    Are you currently following a meal plan? No    Are you taking your medications as prescribed? Not on Medications      Health Coping   How would you rate your overall health? Good      Psychosocial Assessment   Patient Belief/Attitude about Diabetes Motivated to manage diabetes    What is the hardest part about your diabetes right now, causing you the most concern, or is the most worrisome to you about your diabetes?   Making healty food and beverage choices;Checking blood sugar    Self-care barriers None    Self-management support Family    Patient Concerns Nutrition/Meal planning;Healthy Lifestyle;Weight Control    Special Needs None    Preferred Learning Style Visual;Hands on    Learning Readiness Ready    How often do you need to have someone help you when you read instructions, pamphlets, or other written materials from your doctor or pharmacy? 1 - Never      Pre-Education Assessment   Patient understands the diabetes  disease and treatment process. Needs Instruction    Patient understands incorporating nutritional management into lifestyle. Needs Instruction    Patient undertands incorporating physical activity into lifestyle. Needs Instruction    Patient understands using medications safely. Needs Instruction    Patient understands monitoring blood glucose, interpreting and using results Needs Instruction    Patient understands prevention, detection, and treatment of acute complications. Needs Instruction    Patient understands prevention, detection, and treatment of chronic complications. Needs Instruction    Patient understands how to develop strategies to address psychosocial issues. Needs Instruction    Patient understands how to develop strategies to promote health/change behavior. Needs Instruction      Complications   Last HgB A1C per patient/outside source 6.5 %    How often do you check your blood sugar? 0 times/day (not testing)    Number of hypoglycemic episodes per month 0    Have you had a dilated eye exam in the past 12 months? No    Have you had a dental exam in the past 12 months? Yes    Are you checking your feet? No      Dietary Intake   Breakfast granola bar    Lunch eaten out    [employer] or meat + rice + sometimes vegetables or pizza  Beverage(s) hot coco + coffee, water, milk coffee drink, suagr free  soda      Activity / Exercise   Activity / Exercise Type Light (walking / raking leaves)    How many days per week do you exercise? 6    How many minutes per day do you exercise? 60    Total minutes per week of exercise 360      Patient Education   Previous Diabetes Education No    Disease Pathophysiology Definition of diabetes, type 1 and 2, and the diagnosis of diabetes;Factors that contribute to the development of diabetes    Healthy Eating Role of diet in the treatment of diabetes and the relationship between the three main macronutrients and blood glucose level;Food  label reading, portion sizes and measuring food.;Plate Method;Reviewed blood glucose goals for pre and post meals and how to evaluate the patients' food intake on their blood glucose level.;Information on hints to eating out and maintain blood glucose control.    Being Active Role of exercise on diabetes management, blood pressure control and cardiac health.    Medications Reviewed patients medication for diabetes, action, purpose, timing of dose and side effects.    Monitoring Taught/evaluated SMBG meter.;Daily foot exams;Yearly dilated eye exam    Acute complications Taught prevention, symptoms, and  treatment of hypoglycemia - the 15 rule.;Discussed and identified patients' prevention, symptoms, and treatment of hyperglycemia.    Chronic complications Retinopathy and reason for yearly dilated eye exams;Dental care;Nephropathy, what it is, prevention of, the use of ACE, ARB's and early detection of through urine microalbumia.;Assessed and discussed foot care and prevention of foot problems    Diabetes Stress and Support Identified and addressed patients feelings and concerns about diabetes;Role of stress on diabetes      Individualized Goals (developed by patient)   Nutrition Follow meal plan discussed;General guidelines for healthy choices and portions discussed    Physical Activity Exercise 1-2 times per week;15 minutes per day    Monitoring  Test my blood glucose as discussed    Problem Solving Eating Pattern      Post-Education Assessment   Patient understands the diabetes disease and treatment process. Needs Instruction    Patient understands incorporating nutritional management into lifestyle. Needs Instruction    Patient undertands incorporating physical activity into lifestyle. Needs Instruction    Patient understands using medications safely. Needs Instruction    Patient understands monitoring blood glucose, interpreting and using results Needs Instruction    Patient understands  prevention, detection, and treatment of acute complications. Needs Instruction    Patient understands prevention, detection, and treatment of chronic complications. Needs Instruction    Patient understands how to develop strategies to address psychosocial issues. Needs Instruction    Patient understands how to develop strategies to promote health/change behavior. Needs Instruction      Outcomes   Expected Outcomes Demonstrated interest in learning but significant barriers to change    Future DMSE 4-6 wks    Program Status Completed          Individualized Plan for Diabetes Self-Management Training:   Learning Objective:  Patient will have a greater understanding of diabetes self-management. Patient education plan is to attend individual and/or group sessions per assessed needs and concerns.   Expected Outcomes:  Demonstrated interest in learning but significant barriers to change  Education material provided: ADA - How to Thrive: A Guide for Your Journey with Diabetes, A1C conversion sheet, Meal plan card, and My Plate  If problems or  questions, patient to contact team via:  Phone and Email  Future DSME appointment: 4-6 wks
